# Patient Record
Sex: Female | Born: 1990 | Race: Black or African American | Hispanic: No | Marital: Single | Smoking: Current every day smoker
Health system: Southern US, Community
[De-identification: ages and names within clinical notes are randomized; demographics above are authoritative.]

## PROBLEM LIST (undated history)

## (undated) DIAGNOSIS — R569 Unspecified convulsions: Secondary | ICD-10-CM

---
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
## 2012-10-19 ENCOUNTER — Other Ambulatory Visit: Payer: Self-pay | Admitting: Physical Medicine and Rehabilitation

## 2013-04-14 ENCOUNTER — Emergency Department (HOSPITAL_COMMUNITY): Payer: Self-pay

## 2013-04-14 ENCOUNTER — Encounter (HOSPITAL_COMMUNITY): Payer: Self-pay | Admitting: Emergency Medicine

## 2013-04-14 ENCOUNTER — Emergency Department (HOSPITAL_COMMUNITY)
Admission: EM | Admit: 2013-04-14 | Discharge: 2013-04-14 | Disposition: A | Discharge status: Home / Self Care | Payer: Self-pay | Attending: Emergency Medicine | Admitting: Emergency Medicine

## 2013-04-14 DIAGNOSIS — R51 Headache: Secondary | ICD-10-CM | POA: Insufficient documentation

## 2013-04-14 DIAGNOSIS — F17200 Nicotine dependence, unspecified, uncomplicated: Secondary | ICD-10-CM | POA: Insufficient documentation

## 2013-04-14 DIAGNOSIS — R569 Unspecified convulsions: Secondary | ICD-10-CM | POA: Insufficient documentation

## 2013-04-14 DIAGNOSIS — Z3202 Encounter for pregnancy test, result negative: Secondary | ICD-10-CM | POA: Insufficient documentation

## 2013-04-14 LAB — COMPREHENSIVE METABOLIC PANEL
ALT: 8 U/L (ref 0–35)
AST: 15 U/L (ref 0–37)
Alkaline Phosphatase: 41 U/L (ref 39–117)
CO2: 25 mEq/L (ref 19–32)
Chloride: 104 mEq/L (ref 96–112)
GFR calc non Af Amer: >90 mL/min (ref >90)
Sodium: 141 mEq/L (ref 135–145)
Total Bilirubin: 0.5 mg/dL (ref 0.3–1.2)

## 2013-04-14 LAB — PREGNANCY, URINE: Preg Test, Ur: NEGATIVE

## 2013-04-14 LAB — RAPID URINE DRUG SCREEN, HOSP PERFORMED
Barbiturates: NOT DETECTED
Cocaine: NOT DETECTED
Tetrahydrocannabinol: POSITIVE — AB

## 2013-04-14 LAB — CBC WITH DIFFERENTIAL/PLATELET
Basophils Absolute: 0 10*3/uL (ref 0.0–0.1)
HCT: 36.5 % (ref 36.0–46.0)
Lymphocytes Relative: 22 % (ref 12–46)
Neutro Abs: 5.3 10*3/uL (ref 1.7–7.7)
Platelets: 285 10*3/uL (ref 150–400)
RBC: 4.39 MIL/uL (ref 3.87–5.11)
RDW: 14.5 % (ref 11.5–15.5)
WBC: 7.6 10*3/uL (ref 4.0–10.5)

## 2013-04-14 LAB — URINALYSIS, ROUTINE W REFLEX MICROSCOPIC
Bilirubin Urine: NEGATIVE
Hgb urine dipstick: NEGATIVE
Protein, ur: NEGATIVE mg/dL
Urobilinogen, UA: 1 mg/dL (ref 0.0–1.0)

## 2013-04-14 LAB — URINE MICROSCOPIC-ADD ON

## 2013-04-14 MED ORDER — IBUPROFEN 800 MG PO TABS
800.0000 mg | ORAL_TABLET | Freq: Once | ORAL | Status: AC
  Administered 2013-04-14: 800 mg via ORAL
  Filled 2013-04-14: qty 1

## 2013-04-14 NOTE — ED Provider Notes (Addendum)
CSN: [id number]     Arrival date & time 04/14/13  0727 History     First MD Initiated Contact with Patient 04/14/13 0730     No chief complaint on file.  (Consider location/radiation/quality/duration/timing/severity/associated sxs/prior Treatment) HPI Comments: Patient arrives via EMS after apparent seizure activity. Her mother heard her fall and son are saw her on the floor having tonic-clonic movements, foaming at the mouth and staring gaze. This lasted 5-10 seconds. Patient was then postictal with staring and unresponsiveness for several minutes. She is now awake and alert. She complains of a headache. No history of seizures. Patient states that her friend told her 2 days ago she had some shaking during her sleep. There is a family history of seizures. Patient denies any other medical problems. She's not take any medications. She is not on birth control. Denies any possibility of pregnancy. She did have tongue biting, no incontinence. No recent illness or fever.  The history is provided by the patient, a relative and the EMS personnel. The history is limited by the condition of the patient.    History reviewed. No pertinent past medical history. History reviewed. No pertinent past surgical history. No family history on file. History  Substance Use Topics  . Smoking status: Current Every Day Smoker -- 0.50 packs/day    Types: Cigarettes  . Smokeless tobacco: Not on file  . Alcohol Use: Yes     Comment: occasionally.   OB History   Grav Para Term Preterm Abortions TAB SAB Ect Mult Living                 Review of Systems  Constitutional: Negative for fever, activity change and appetite change.  HENT: Negative for congestion and rhinorrhea.   Respiratory: Negative for cough, chest tightness and shortness of breath.   Cardiovascular: Negative for chest pain.  Gastrointestinal: Negative for nausea, vomiting and abdominal pain.  Genitourinary: Negative for dysuria, hematuria, vaginal  bleeding and vaginal discharge.  Musculoskeletal: Negative for back pain.  Skin: Negative for rash.  Neurological: Positive for seizures and headaches. Negative for dizziness and weakness.  A complete 10 system review of systems was obtained and all systems are negative except as noted in the HPI and PMH.    Allergies  Review of patient's allergies indicates no known allergies.  Home Medications   Current Outpatient Rx  Name  Route  Sig  Dispense  Refill  . acetaminophen (TYLENOL) 500 MG tablet   Oral   Take 1,000 mg by mouth every 6 (six) hours as needed (headache).          BP 107/58  Pulse 68  Temp(Src) 98.5 F (36.9 C) (Oral)  Resp 15  Ht 5\' 1"  (1.549 m)  Wt 135 lb (61.236 kg)  BMI 25.52 kg/m2  SpO2 98%  LMP 03/28/2013 Physical Exam  Constitutional: She is oriented to person, place, and time. She appears well-developed and well-nourished. No distress.  Alert and oriented x 3.  HENT:  Head: Normocephalic and atraumatic.  Mouth/Throat: Oropharynx is clear and moist. No oropharyngeal exudate.  Abrasion to R tongue  Eyes: Conjunctivae and EOM are normal. Pupils are equal, round, and reactive to light.  Neck: Normal range of motion. Neck supple.  Upper C spine tenderness  Cardiovascular: Normal rate, regular rhythm and normal heart sounds.   No murmur heard. Pulmonary/Chest: Effort normal and breath sounds normal. No respiratory distress.  Abdominal: Soft. There is no tenderness. There is no rebound and no guarding.  Musculoskeletal: Normal range of motion. She exhibits no edema and no tenderness.  Neurological: She is alert and oriented to person, place, and time. No cranial nerve deficit. She exhibits normal muscle tone. Coordination normal.  CN 2-12 intact, no ataxia on finger to nose, no nystagmus, 5/5 strength throughout, no pronator drift, Romberg negative, normal gait.  Skin: Skin is warm.    ED Course   Procedures (including critical care time)  Labs  Reviewed  COMPREHENSIVE METABOLIC PANEL - Abnormal; Notable for the following:    Potassium 3.2 (*)    All other components within normal limits  URINALYSIS, ROUTINE W REFLEX MICROSCOPIC - Abnormal; Notable for the following:    APPearance CLOUDY (*)    Ketones, ur 40 (*)    Leukocytes, UA TRACE (*)    All other components within normal limits  URINE RAPID DRUG SCREEN (HOSP PERFORMED) - Abnormal; Notable for the following:    Tetrahydrocannabinol POSITIVE (*)    All other components within normal limits  URINE MICROSCOPIC-ADD ON - Abnormal; Notable for the following:    Squamous Epithelial / LPF MANY (*)    Bacteria, UA FEW (*)    All other components within normal limits  URINE CULTURE  PREGNANCY, URINE  CBC WITH DIFFERENTIAL   Ct Head Wo Contrast  04/14/2013   *RADIOLOGY REPORT*  Clinical Data:  Unwitnessed seizure, fall, found postictal  CT HEAD WITHOUT CONTRAST CT CERVICAL SPINE WITHOUT CONTRAST  Technique:  Multidetector CT imaging of the head and cervical spine was performed following the standard protocol without intravenous contrast.  Multiplanar CT image reconstructions of the cervical spine were also generated.  Comparison:  None  CT HEAD  Findings: Normal ventricular morphology. No midline shift or mass effect. Normal appearance of brain parenchyma. No intracranial hemorrhage, mass lesion, or acute infarction. Visualized paranasal sinuses and mastoid air cells clear. Bones unremarkable.  IMPRESSION: No acute intracranial abnormalities.  CT CERVICAL SPINE  Findings: Visualized skull base intact. Osseous mineralization normal. Vertebral body and disc space heights maintained. Prevertebral soft tissues normal thickness. No fracture, subluxation or bone destruction. Soft tissues unremarkable.  IMPRESSION: No acute cervical spine abnormalities.   Original Report Authenticated By: Ulyses Southward, M.D.   Ct Cervical Spine Wo Contrast  04/14/2013   *RADIOLOGY REPORT*  Clinical Data:   Unwitnessed seizure, fall, found postictal  CT HEAD WITHOUT CONTRAST CT CERVICAL SPINE WITHOUT CONTRAST  Technique:  Multidetector CT imaging of the head and cervical spine was performed following the standard protocol without intravenous contrast.  Multiplanar CT image reconstructions of the cervical spine were also generated.  Comparison:  None  CT HEAD  Findings: Normal ventricular morphology. No midline shift or mass effect. Normal appearance of brain parenchyma. No intracranial hemorrhage, mass lesion, or acute infarction. Visualized paranasal sinuses and mastoid air cells clear. Bones unremarkable.  IMPRESSION: No acute intracranial abnormalities.  CT CERVICAL SPINE  Findings: Visualized skull base intact. Osseous mineralization normal. Vertebral body and disc space heights maintained. Prevertebral soft tissues normal thickness. No fracture, subluxation or bone destruction. Soft tissues unremarkable.  IMPRESSION: No acute cervical spine abnormalities.   Original Report Authenticated By: Ulyses Southward, M.D.   1. Seizure     MDM  Seizure activity witnessed by the mother. It is now resolved. Patient appears back to baseline. No evidence of trauma. Vitals stable.  CT head negative for acute pathology.  Discussed with Dr. Roseanne Reno of neurology. He agrees it is difficult to say what patient's episode of shaking on Monday  was. He agrees with not starting antiepileptics at this point.  Patient is back to baseline which is confirmed by mother. She is ambulatory and tolerating by mouth. Urinalysis negative. HCG negative. Drug screen positive for THC. Followup with neurology given. Return precautions discussed including recurrent seizures, worsening headache, change in mental status, vomiting or any other concerns. Patient instructed not to drive until followup with neurology.   Glynn Octave, MD 04/14/13 1114  Glynn Octave, MD 04/14/13 1124

## 2013-04-14 NOTE — ED Notes (Signed)
Per EMS - mother heard a noise in the bedroom, pt was on the floor, pt appeared to be post-ictal. This happened about a week ago. No movements seen but starring into one direction. Pt unaware of situation, became more alert in EMS truck, able to answer questions correctly. CBG 84. BP 110/60 HR 80 NSR RR 16. EMS started a 20G in left hand. No hx of seizures.

## 2013-04-14 NOTE — ED Notes (Signed)
Pt reports she is unsure what happened, sts she was getting ready for school then next thing she knows she was in the back of the EMS truck. Pt in nad, skin warm and dry, resp e/u.

## 2013-04-14 NOTE — ED Notes (Signed)
Pt discharged.Vital signs stable and GCS 15 

## 2013-04-15 LAB — URINE CULTURE

## 2013-04-22 ENCOUNTER — Ambulatory Visit: Payer: Self-pay | Admitting: Neurology

## 2013-04-22 ENCOUNTER — Emergency Department (HOSPITAL_COMMUNITY)
Admission: EM | Admit: 2013-04-22 | Discharge: 2013-04-22 | Disposition: A | Discharge status: Home / Self Care | Payer: Self-pay | Attending: Emergency Medicine | Admitting: Emergency Medicine

## 2013-04-22 ENCOUNTER — Emergency Department (HOSPITAL_COMMUNITY): Payer: Self-pay

## 2013-04-22 ENCOUNTER — Encounter (HOSPITAL_COMMUNITY): Payer: Self-pay | Admitting: *Deleted

## 2013-04-22 DIAGNOSIS — R4789 Other speech disturbances: Secondary | ICD-10-CM | POA: Insufficient documentation

## 2013-04-22 DIAGNOSIS — F29 Unspecified psychosis not due to a substance or known physiological condition: Secondary | ICD-10-CM | POA: Insufficient documentation

## 2013-04-22 DIAGNOSIS — Z79899 Other long term (current) drug therapy: Secondary | ICD-10-CM | POA: Insufficient documentation

## 2013-04-22 DIAGNOSIS — G40909 Epilepsy, unspecified, not intractable, without status epilepticus: Secondary | ICD-10-CM | POA: Insufficient documentation

## 2013-04-22 DIAGNOSIS — H539 Unspecified visual disturbance: Secondary | ICD-10-CM | POA: Insufficient documentation

## 2013-04-22 DIAGNOSIS — R569 Unspecified convulsions: Secondary | ICD-10-CM

## 2013-04-22 DIAGNOSIS — F17200 Nicotine dependence, unspecified, uncomplicated: Secondary | ICD-10-CM | POA: Insufficient documentation

## 2013-04-22 HISTORY — DX: Unspecified convulsions: R56.9

## 2013-04-22 LAB — POCT I-STAT, CHEM 8
Calcium, Ion: 1.18 mmol/L (ref 1.12–1.23)
Chloride: 105 mEq/L (ref 96–112)
Creatinine, Ser: 0.7 mg/dL (ref 0.50–1.10)
Glucose, Bld: 88 mg/dL (ref 70–99)
HCT: 42 % (ref 36.0–46.0)
Hemoglobin: 14.3 g/dL (ref 12.0–15.0)
Potassium: 3.9 mEq/L (ref 3.5–5.1)

## 2013-04-22 MED ORDER — SODIUM CHLORIDE 0.9 % IV SOLN
1000.0000 mg | INTRAVENOUS | Status: AC
  Administered 2013-04-22: 1000 mg via INTRAVENOUS
  Filled 2013-04-22 (×2): qty 10

## 2013-04-22 MED ORDER — LEVETIRACETAM 500 MG PO TABS
500.0000 mg | ORAL_TABLET | Freq: Two times a day (BID) | ORAL | Status: DC
  Filled 2013-04-22 (×2): qty 1

## 2013-04-22 MED ORDER — LEVETIRACETAM 500 MG PO TABS: 500.0000 mg | ORAL_TABLET | Freq: Two times a day (BID) | ORAL | Status: DC

## 2013-04-22 NOTE — ED Notes (Addendum)
Per pt and pt's mother, pt reports that she was dx'd with a seizure (new onset) last week on Wed. 04/14/13.  She states that there was a follow up with a "neurologist" today 0900.  She states that while she was at school yesterday she noticed that she was having difficulty with her words "coming out correctly".  She is A/ox 4 no distress noted.  Speech clear and coherent, non slurred.  No neurological deficits noted at the present.  Pt states that she still feels as though her words are not coming out correctly.  Pt states that her neurologist is Dr. Anne Hahn

## 2013-04-22 NOTE — ED Provider Notes (Addendum)
CSN: [id number]     Arrival date & time 04/22/13  4098 History   First MD Initiated Contact with Patient 04/22/13 [phone]     Chief Complaint  Patient presents with  . Altered Mental Status   (Consider location/radiation/quality/duration/timing/severity/associated sxs/prior Treatment) HPI Patient seen on 04/14/2013 for possible seizure in which he had a shaking episode, described as tonic-clonic movement, foaming at the mouth, and biting her tongue. She had a negative workup at that time including a CT of the head, blood work, and urine studies. Her case was discussed with neurology and it was recommended that she followup as an outpatient for seizure workup. At that point no antiepileptics were started. The patient says she has been feeling better since the episode on 8/20 until yesterday. She states that while she was at school she became confused. She said she was having trouble thinking clearly her and that her words that she wanted to say he would not come out. This has persisted until today. She was supposed to followup with neurology this morning but instead came to the emergency department for evaluation. She denies focal weakness, numbness, vision changes, unsteady gait. Past Medical History  Diagnosis Date  . Seizures    History reviewed. No pertinent past surgical history. Family History  Problem Relation Age of Onset  . Hypertension Mother   . Hypertension Father   . Seizures Father    History  Substance Use Topics  . Smoking status: Current Every Day Smoker -- 0.50 packs/day    Types: Cigarettes  . Smokeless tobacco: Not on file  . Alcohol Use: Yes     Comment: occasionally.   OB History   Grav Para Term Preterm Abortions TAB SAB Ect Mult Living                 Review of Systems  Constitutional: Negative for fever and chills.  Eyes: Positive for visual disturbance.  Respiratory: Negative for shortness of breath.   Cardiovascular: Negative for chest pain.   Gastrointestinal: Negative for nausea, vomiting, abdominal pain and diarrhea.  Musculoskeletal: Negative for myalgias and back pain.  Skin: Negative for rash and wound.  Neurological: Positive for speech difficulty. Negative for dizziness, seizures, syncope, weakness, light-headedness, numbness and headaches.  Psychiatric/Behavioral: Positive for confusion.  All other systems reviewed and are negative.    Allergies  Review of patient's allergies indicates no known allergies.  Home Medications   Current Outpatient Rx  Name  Route  Sig  Dispense  Refill  . acetaminophen (TYLENOL) 500 MG tablet   Oral   Take 1,000 mg by mouth every 6 (six) hours as needed (headache).         . levETIRAcetam (KEPPRA) 500 MG tablet   Oral   Take 1 tablet (500 mg total) by mouth 2 (two) times daily.   60 tablet   0    BP 113/71  Pulse 79  Temp(Src) 98.1 F (36.7 C) (Oral)  Resp 16  Ht 5\' 1"  (1.549 m)  Wt 130 lb (58.968 kg)  BMI 24.58 kg/m2  SpO2 99%  LMP 03/28/2013 Physical Exam  Nursing note and vitals reviewed. Constitutional: She is oriented to person, place, and time. She appears well-developed and well-nourished. No distress.  HENT:  Head: Normocephalic and atraumatic.  Mouth/Throat: Oropharynx is clear and moist. No oropharyngeal exudate.  Eyes: EOM are normal. Pupils are equal, round, and reactive to light.  Neck: Normal range of motion. Neck supple.  Cardiovascular: Normal rate and regular rhythm.  Pulmonary/Chest: Effort normal and breath sounds normal. No respiratory distress. She has no wheezes. She has no rales. She exhibits no tenderness.  Abdominal: Soft. Bowel sounds are normal. She exhibits no distension and no mass. There is no tenderness. There is no rebound and no guarding.  Musculoskeletal: Normal range of motion. She exhibits no edema and no tenderness.  Neurological: She is alert and oriented to person, place, and time.  Patient is alert and oriented x3. Has  clear, goal oriented speech with some hesitancy. She has 5/5 motor in all extremities. Sensation is intact to light touch. Bilateral finger-to-nose is normal with no signs of dysmetria. She has a normal gait and walks without assistance.  Skin: Skin is warm and dry. No rash noted. No erythema.  Psychiatric: She has a normal mood and affect. Her behavior is normal.    ED Course  Procedures (including critical care time) Labs Review Labs Reviewed  POCT I-STAT, CHEM 8   Imaging Review Mr Brain Wo Contrast  04/22/2013   *RADIOLOGY REPORT*  Clinical Data: New onset seizure  MRI HEAD WITHOUT CONTRAST  Technique:  Multiplanar, multiecho pulse sequences of the brain and surrounding structures were obtained according to standard protocol without intravenous contrast.  Comparison: CT 04/14/2013  Findings: Ventricle size is normal.  Negative for Chiari malformation.  Pituitary is normal in size.  The corpus callosum is well formed.  Small hyperintensities in the frontal  and parietal white matter bilaterally.  Brainstem and cerebellum are normal.  Basal ganglia is normal.  Negative for acute infarct.  Negative for hemorrhage or mass lesion.  Temporal lobe anatomy appears normal.  Hippocampal volume and signal is normal bilaterally.  IMPRESSION: No acute abnormality.  Small hyperintensities in the frontal parietal white matter bilaterally.  These are nonspecific but are most likely chronic and could be related to chronic microvascular ischemic change or migraine headache or vasculitis.   Original Report Authenticated By: Janeece Riggers, M.D.    MDM   Discussed with Dr. Thad Ranger. She suggests getting an EEG and MRI in the ED and she will evaluate likely to be discharged home  Loren Racer, MD 04/22/13 1400  Seen by Dr. Okey Dupre in the emergency department patient had a normal EEG and MRI. Dr. Derrick Ravel is unsure whether this is an actual seizure the patient is having. Suggests putting Keppra and starting Keppra 500  mg twice a day. Patient is to followup for neurology. Return precautions have been given.  Loren Racer, MD 04/22/13 [phone]

## 2013-04-22 NOTE — Procedures (Signed)
ELECTROENCEPHALOGRAM REPORT   Patient: Bethany Phelps       Room #: ED EEG No. ID: [id number] Age: 22 y.o.        Sex: female Referring Physician: Ranae Palms Report Date:  04/22/2013        Interpreting Physician: Thana Farr D  History: [name] is an 22 y.o. female with new onset seizures  Medications:  Scheduled: . levETIRAcetam  500 mg Oral BID    Conditions of Recording:  This is a 16 channel EEG carried out with the patient in the awake, drowsy and asleep states.  Description:  The waking background activity consists of a low voltage, symmetrical, fairly well organized, 10 Hz alpha activity, seen from the parieto-occipital and posterior temporal regions.  Low voltage fast activity, poorly organized, is seen anteriorly and is at times superimposed on more posterior regions.  A mixture of theta and alpha rhythms are seen from the central and temporal regions. The patient drowses with slowing to irregular, low voltage theta and beta activity.   The patient goes in to a light sleep with symmetrical sleep spindles, vertex central sharp transients and irregular slow activity.   Hyperventilation produced a mild to moderate buildup but failed to elicit any abnormalities.  Intermittent photic stimulation was performed but failed to illicit any change in the tracing.     IMPRESSION: Normal electroencephalogram, awake, asleep and with activation procedures. There are no focal lateralizing or epileptiform features.   Thana Farr, MD [facility] [phone] 04/22/2013, 3:32 PM

## 2013-04-22 NOTE — ED Notes (Signed)
Patient is resting comfortably. Returned from EEG with mother at bedside. Patient eating crackers that were given to her by Mother.  Patient is requesting water. Spoke with Dr. Ranae Palms who ordered for patient to be NPO until seen by neurology. Patient advised nothing further to eat or drink until seen by neurology. Patient agreeable. Reconnected to cardiac monitor. Seizure precautions remain in place.

## 2013-04-22 NOTE — Progress Notes (Signed)
STAT EEG completed  

## 2013-04-22 NOTE — Consult Note (Signed)
NEURO HOSPITALIST CONSULT NOTE    Reason for Consult: Seizure  HPI:                                                                                                                                          Bethany Phelps is an 22 y.o. female who had a appointment to see Dr. Anne Hahn of [facility] today but cancled her appointment yesterday morning due to feeling well. Last evening she was studying and "felt weird, it was like I was reading the pages but the information was just not getting into my head".  There was never any jerking activity, lip smacking,tongue biting or incontinence.  Patient currently feels fine at this time.  She is very inquisitive about what post-ictal means and what medication she will be on.   Past Medical History  Diagnosis Date  . Seizures     No past surgical history on file.  Family History  Problem Relation Age of Onset  . Hypertension Mother   . Hypertension Father   . Seizures Father      Social History:  reports that she has been smoking Cigarettes.  She has been smoking about 0.50 packs per day. She does not have any smokeless tobacco history on file. She reports that  drinks alcohol. She reports that she uses illicit drugs (Marijuana).  No Known Allergies  MEDICATIONS:                                                                                                                     Current Facility-Administered Medications  Medication Dose Route Frequency Provider Last Rate Last Dose  . levETIRAcetam (KEPPRA) 1,000 mg in sodium chloride 0.9 % 100 mL IVPB  1,000 mg Intravenous STAT Ulice Dash, PA-C      . levETIRAcetam (KEPPRA) tablet 500 mg  500 mg Oral BID Ulice Dash, PA-C       Current Outpatient Prescriptions  Medication Sig Dispense Refill  . acetaminophen (TYLENOL) 500 MG tablet Take 1,000 mg by mouth every 6 (six) hours as needed (headache).          ROS:  History obtained from the patient  General ROS: negative for - chills, fatigue, fever, night sweats, weight gain or weight loss Psychological ROS: negative for - behavioral disorder, hallucinations, memory difficulties, mood swings or suicidal ideation Ophthalmic ROS: negative for - blurry vision, double vision, eye pain or loss of vision ENT ROS: negative for - epistaxis, nasal discharge, oral lesions, sore throat, tinnitus or vertigo Allergy and Immunology ROS: negative for - hives or itchy/watery eyes Hematological and Lymphatic ROS: negative for - bleeding problems, bruising or swollen lymph nodes Endocrine ROS: negative for - galactorrhea, hair pattern changes, polydipsia/polyuria or temperature intolerance Respiratory ROS: negative for - cough, hemoptysis, shortness of breath or wheezing Cardiovascular ROS: negative for - chest pain, dyspnea on exertion, edema or irregular heartbeat Gastrointestinal ROS: negative for - abdominal pain, diarrhea, hematemesis, nausea/vomiting or stool incontinence Genito-Urinary ROS: negative for - dysuria, hematuria, incontinence or urinary frequency/urgency Musculoskeletal ROS: negative for - joint swelling or muscular weakness Neurological ROS: as noted in HPI Dermatological ROS: negative for rash and skin lesion changes   Blood pressure 102/57, pulse 89, temperature 98.1 F (36.7 C), temperature source Oral, resp. rate 16, height 5\' 1"  (1.549 m), weight 58.968 kg (130 lb), last menstrual period 03/28/2013, SpO2 100.00%.   Neurologic Examination:                                                                                                      Mental Status: Alert, oriented, thought content appropriate.  Speech fluent without evidence of aphasia.  Able to follow 3 step commands without difficulty. Cranial Nerves: II: Discs flat bilaterally; Visual fields grossly normal,  pupils equal, round, reactive to light and accommodation III,IV, VI: ptosis not present, extra-ocular motions intact bilaterally V,VII: smile symmetric, facial light touch sensation normal bilaterally VIII: hearing normal bilaterally IX,X: gag reflex present XI: bilateral shoulder shrug XII: midline tongue extension Motor: Right : Upper extremity   5/5    Left:     Upper extremity   5/5  Lower extremity   5/5     Lower extremity   5/5 Tone and bulk:normal tone throughout; no atrophy noted Sensory: Pinprick and light touch intact throughout, bilaterally Deep Tendon Reflexes:  Right: Upper Extremity   Left: Upper extremity   biceps (C-5 to C-6) 2/4   biceps (C-5 to C-6) 2/4 tricep (C7) 2/4    triceps (C7) 2/4 Brachioradialis (C6) 2/4  Brachioradialis (C6) 2/4  Lower Extremity Lower Extremity  quadriceps (L-2 to L-4) 2/4   quadriceps (L-2 to L-4) 2/4 Achilles (S1) 2/4   Achilles (S1) 2/4  Plantars: Right: downgoing   Left: downgoing Cerebellar: normal finger-to-nose,  normal heel-to-shin test Gait: normal CV: pulses palpable throughout    No components found with this basename: cbc,  bmp,  coags,  chol,  tri,  ldl,  hga1c    Results for orders placed during the hospital encounter of 04/22/13 (from the past 48 hour(s))  POCT I-STAT, CHEM 8     Status: None   Collection Time    04/22/13  8:29 AM  Result Value Range   Sodium 141  135 - 145 mEq/L   Potassium 3.9  3.5 - 5.1 mEq/L   Chloride 105  96 - 112 mEq/L   BUN 6  6 - 23 mg/dL   Creatinine, Ser 1.61  0.50 - 1.10 mg/dL   Glucose, Bld 88  70 - 99 mg/dL   Calcium, Ion 0.96  0.45 - 1.23 mmol/L   TCO2 25  0 - 100 mmol/L   Hemoglobin 14.3  12.0 - 15.0 g/dL   HCT 40.9  81.1 - 91.4 %    Mr Brain Wo Contrast  04/22/2013   *RADIOLOGY REPORT*  Clinical Data: New onset seizure  MRI HEAD WITHOUT CONTRAST  Technique:  Multiplanar, multiecho pulse sequences of the brain and surrounding structures were obtained according to  standard protocol without intravenous contrast.  Comparison: CT 04/14/2013  Findings: Ventricle size is normal.  Negative for Chiari malformation.  Pituitary is normal in size.  The corpus callosum is well formed.  Small hyperintensities in the frontal  and parietal white matter bilaterally.  Brainstem and cerebellum are normal.  Basal ganglia is normal.  Negative for acute infarct.  Negative for hemorrhage or mass lesion.  Temporal lobe anatomy appears normal.  Hippocampal volume and signal is normal bilaterally.  IMPRESSION: No acute abnormality.  Small hyperintensities in the frontal parietal white matter bilaterally.  These are nonspecific but are most likely chronic and could be related to chronic microvascular ischemic change or migraine headache or vasculitis.   Original Report Authenticated By: Janeece Riggers, M.D.   EEG--showed no epileptiform activity.   No driving, operating heavy machinery, perform activities at heights, swimming or participation in water activities until release by outpatient physician.  This has been discussed with patient.   Felicie Morn PA-C [facility] [phone]  04/22/2013, 12:36 PM   Patient seen and examined.  Clinical course and management discussed.  Necessary edits performed.  I agree with the above.  Assessment and plan of care developed and discussed below.    Assessment/Plan: 22 year old female presenting with recurrent seizures.  Initial work up was unremarkable and patient was released without antiepileptic treatment.  Patient now returns with recurrent seizure activity.  Neurologic examination unremarkable.    Recommendations: 1.  MRI of the brain.  (reviewed and shows no acute abnormalities) 2.  EEG.  (performed in the ED.  EEG reviewed and unremarkable) 3.  Start Keppra.  1000mg  IV load to be given now.  Patient then to start maintenance at 500mg  po BID 4.  Follow up with Dr. Anne Hahn as an outpatient.   5.  Patient unable to drive, operate  heavy machinery, perform activities at heights and participate in water activities until release by outpatient physician.  Thana Farr, MD [facility] [phone]  04/22/2013  2:30 PM

## 2013-05-04 ENCOUNTER — Emergency Department (HOSPITAL_COMMUNITY)
Admission: EM | Admit: 2013-05-04 | Discharge: 2013-05-04 | Disposition: A | Discharge status: Home / Self Care | Payer: Medicaid Other | Attending: Emergency Medicine | Admitting: Emergency Medicine

## 2013-05-04 ENCOUNTER — Encounter (HOSPITAL_COMMUNITY): Payer: Self-pay

## 2013-05-04 DIAGNOSIS — F17200 Nicotine dependence, unspecified, uncomplicated: Secondary | ICD-10-CM | POA: Insufficient documentation

## 2013-05-04 DIAGNOSIS — Z79899 Other long term (current) drug therapy: Secondary | ICD-10-CM | POA: Insufficient documentation

## 2013-05-04 DIAGNOSIS — R569 Unspecified convulsions: Secondary | ICD-10-CM

## 2013-05-04 DIAGNOSIS — G40909 Epilepsy, unspecified, not intractable, without status epilepticus: Secondary | ICD-10-CM | POA: Insufficient documentation

## 2013-05-04 DIAGNOSIS — F1210 Cannabis abuse, uncomplicated: Secondary | ICD-10-CM | POA: Insufficient documentation

## 2013-05-04 DIAGNOSIS — F1290 Cannabis use, unspecified, uncomplicated: Secondary | ICD-10-CM

## 2013-05-04 MED ORDER — LAMOTRIGINE 25 MG PO TABS
25.0000 mg | ORAL_TABLET | Freq: Once | ORAL | Status: AC
  Administered 2013-05-04: 25 mg via ORAL
  Filled 2013-05-04: qty 1

## 2013-05-04 MED ORDER — LAMOTRIGINE 25 MG PO TABS: 25.0000 mg | ORAL_TABLET | Freq: Every day | ORAL | Status: DC

## 2013-05-04 NOTE — ED Notes (Addendum)
Pt diagnosed with seizures 3 weeks ago.  Pt was placed on Keppra.  Pt states she stopped taking Keppra on Monday because she didn't like the way it made her feel.  Pt is to follow up with Neurology on the 16th.  Pt's mother states she witnessed 2 seizures tonight.  Pt's mother states that the seizures consisted of her "being in a daze and then she had jerking/shaking movements and was foaming at the mouth."  Pt's mother states it lasted approximately 5-6 minutes and then had a second seizure lasting just a second.  Pt's mother states that when pt came to she was agitated.  Pt states she could hear her mother talking, but mother is overtalking her.  EMS reports that upon their arrival pt was ambulatory, got herself dressed, and walked to ambulance.  They report that pt was using foul language and not being cooperative with any treatment. Pt's mother anxious and states she is not taking her daughter home until someone figures out what is going on.

## 2013-05-04 NOTE — ED Provider Notes (Signed)
CSN: [id number]     Arrival date & time 05/04/13  0025 History   First MD Initiated Contact with Patient 05/04/13 0200     Chief Complaint  Patient presents with  . Seizures   (Consider location/radiation/quality/duration/timing/severity/associated sxs/prior Treatment) HPI  Please note that this is a late entry. The patient was brought into the emergency department by EMS at the request of her mother.  The patient was diagnosed within the last month with seizure disorder despite a negative EEG and normal MRI, per chart review. She was started on Keppra 500 mg twice a day. However, she elected to stop this medication two days ago because she felt emotionally labile while on the medication.  The patient smokes marijuana daily. [  Mom says that the patient had a seizure which lasted approx 7s pta. Mom describes the patient having a blank stair while sitting on mother's bed, patient then started jerking, mother helped the patient from sitting to supine position. Patient was "foaming at the mouth". Episode was self limited. No urinary incontinence. Patient says she does not recall the event.   This is the third seizure like event that the patient has had. The first one occurred < 1 month ago. The patient was seen and evaluated by Dr. Lovell Sheehan as ED consult on 8/28 and she recommended Keppra as noted above.   The patient has f/u with [facility] on Sept 16. However, mom says she she wants to know why the patient is having these seizures. The patient is accompanied by her aunt who says that she thinks the patient needs to be evaluated for a cerebral aneurysm.   The patient is without complaints at this time. She denies any recent headaches. Denies cocaine use.   Past Medical History  Diagnosis Date  . Seizures    History reviewed. No pertinent past surgical history. Family History  Problem Relation Age of Onset  . Hypertension Mother   . Hypertension Father   . Seizures Father    History  Substance  Use Topics  . Smoking status: Current Every Day Smoker -- 0.50 packs/day    Types: Cigarettes  . Smokeless tobacco: Not on file  . Alcohol Use: Yes     Comment: occasionally.   OB History   Grav Para Term Preterm Abortions TAB SAB Ect Mult Living                 Review of Systems Gen: no weight loss, fevers, chills, night sweats Eyes: no discharge or drainage, no occular pain or visual changes Nose: no epistaxis or rhinorrhea Mouth: no dental pain, no sore throat Neck: no neck pain Lungs: no SOB, cough, wheezing CV: no chest pain, palpitations, dependent edema or orthopnea Abd: no abdominal pain, nausea, vomiting GU: no dysuria or gross hematuria MSK: no myalgias or arthralgias Neuro: as per hpi, otherwise negative Skin: no rash Psyche: as per hpi, otherwise negative  Allergies  Review of patient's allergies indicates no known allergies.  Home Medications   Current Outpatient Rx  Name  Route  Sig  Dispense  Refill  . acetaminophen (TYLENOL) 500 MG tablet   Oral   Take 1,000 mg by mouth every 6 (six) hours as needed (headache).         . levETIRAcetam (KEPPRA) 500 MG tablet   Oral   Take 1 tablet (500 mg total) by mouth 2 (two) times daily.   60 tablet   0    BP 125/87  Pulse 95  Resp  29  SpO2 100%  LMP 03/28/2013 Physical Exam Gen: well developed and well nourished appearing Head: NCAT Eyes: PERL, EOMI Nose: no epistaixis or rhinorrhea Mouth/throat: mucosa is moist and pink Neck: supple, no stridor, no midline ttp Lungs: CTA B, no wheezing, rhonchi or rales CV: RRR, no mumur Abd: soft, notender, nondistended Back: no ttp, Skin: no rashese, wnl Neuro: CN ii-xii grossly intact, no focal deficits,. Motor strength 5/5 both arms and legs, normal finger to nose, normal gait Psyche; normal affect,  calm and cooperative.   ED Course  Procedures (including critical care time)   MDM  Case discussed with Dr. Vonita Moss, NeuroHospitalist on call. He recommends  initiating Lamictal 50mg  bid, in favor of Keppra, with plan to f/u with Dr. Andrey Campanile of [facility] on Sept 16, as scheduled.     Brandt Loosen, MD 05/04/13 [phone]

## 2013-05-04 NOTE — ED Notes (Signed)
Patient requested and received a sprite to drink. 

## 2013-05-04 NOTE — ED Notes (Signed)
MD at bedside. 

## 2013-05-11 ENCOUNTER — Encounter: Payer: Self-pay | Admitting: Neurology

## 2013-05-11 ENCOUNTER — Ambulatory Visit (INDEPENDENT_AMBULATORY_CARE_PROVIDER_SITE_OTHER): Payer: Self-pay | Admitting: Neurology

## 2013-05-11 VITALS — BP 116/75 | HR 90 | Ht 61.5 in | Wt 125.0 lb

## 2013-05-11 DIAGNOSIS — R569 Unspecified convulsions: Secondary | ICD-10-CM

## 2013-05-11 MED ORDER — LAMOTRIGINE ER 50 MG PO TB24: 50.0000 mg | ORAL_TABLET | Freq: Every day | ORAL | Status: DC

## 2013-05-11 NOTE — Patient Instructions (Addendum)
Overall you are doing fairly well but I do want to suggest a few things today:   Remember to drink plenty of fluid, eat healthy meals and do not skip any meals. Try to eat protein with a every meal and eat a healthy snack such as fruit or nuts in between meals. Try to keep a regular sleep-wake schedule and try to exercise daily, particularly in the form of walking, 20-30 minutes a day, if you can.   As far as your medications are concerned, I would like to suggest continuing on Lamictal using the following schedule  Week 1: Lamictal 25mg  daily in the morning Week 2 and beyond: Lamictal 50mg  once daily  We will order a lumbar puncture to check for an infectious or inflammatory cause of your seizures  I would like to see you back in 2 months, sooner if we need to. Please call us with any interim questions, concerns, problems, updates or refill requests.   Please also call us for any test results so we can go over those with you on the phone.  My clinical assistant and will answer any of your questions and relay your messages to me and also relay most of my messages to you.   Our phone number is [phone]. We also have an after hours call service for urgent matters and there is a physician on-call for urgent questions. For any emergencies you know to call 911 or go to the nearest emergency room

## 2013-05-11 NOTE — Progress Notes (Signed)
[facility]  Provider:  Dr Hosie Poisson Referring Provider: No ref. provider found Primary Care Physician:  No PCP Per Patient  CC:  seizures  HPI:  Bethany Phelps is a 22 y.o. female here as a referral from the ED for new onset seizure.  Had her first seizure on August 20, then pregnancy on August 28, then third seizure in September 9. Per her mom that first event involved her passed out hitting the floor, she is moving all extremities and foaming at the mouth for around 10 seconds. She is confused for around one hour after. Her lip during the event, no tongue biting no loss of bowel bladder. Second event was similar in nature lasting around 1 minute, with postevent confusion. There event involve patient walking into mother's room mother she was staring blankly mood her lips but not talking no eye blinking and then subsequently started to shake all over last around 10 seconds and confusion afterwards. Patient has no recollection of these events. Recalls waking up in a in the ambulance or on the floor. Prior to the most recent event she reports having a headache, felt diaphoretic, felt out of it and not her normal self. Mom notes that for the past 2-3 weeks she has not been herself, appears confused throughout the day, it fluctuates will have good days and bad days. Has had to withdrawal from college.  Had EEG nad MRI done at hospital which was unremarkable. Initially started on Keppra, felt it made her confused and tired, very emotional.Then switched to Lamictal.  Currently taking Lamictal 25 mg daily. MRI of the brain imaging was reviewed and found to be unremarkable.  No new diet or medications. No change in life or stress. No recent illnesses or infections, no fever no headache no neck stiffness.  No history of seizures. No febrile seizures. Father and maternal grandmother developed seizures in adulthood. No history of head trauma.   Review of Systems: Out of a complete 14  system review, the patient complains of only the following symptoms, and all other reviewed systems are negative. Positive for fatigue memory loss confusion headache seizure passed out depression racing thoughts  History   Social History  . Marital Status: Single    Spouse Name: N/A    Number of Children: 0  . Years of Education: N/A   Occupational History  . Not on file.   Social History Main Topics  . Smoking status: Current Every Day Smoker -- 0.50 packs/day    Types: Cigarettes  . Smokeless tobacco: Never Used  . Alcohol Use: Yes     Comment: occasionally.  . Drug Use: Yes    Special: Marijuana     Comment: everyday other day. last used on sunday.  Marland Kitchen Sexual Activity: Not on file   Other Topics Concern  . Not on file   Social History Narrative   Patient lives at home with her mother.    Patient does not work.   Patient have some college.    Patient has no children.           Family History  Problem Relation Age of Onset  . Hypertension Mother   . Hypertension Father   . Seizures Father     Past Medical History  Diagnosis Date  . Seizures     History reviewed. No pertinent past surgical history.  Current Outpatient Prescriptions  Medication Sig Dispense Refill  . acetaminophen (TYLENOL) 500 MG tablet Take 1,000 mg by mouth every  6 (six) hours as needed (headache).      . lamoTRIgine (LAMICTAL) 25 MG tablet Take 1 tablet (25 mg total) by mouth daily.  20 tablet  0  . levETIRAcetam (KEPPRA) 500 MG tablet Take 1 tablet (500 mg total) by mouth 2 (two) times daily.  60 tablet  0   No current facility-administered medications for this visit.    Allergies as of 05/11/2013  . (No Known Allergies)    Vitals: BP 116/75  Pulse 90  Ht 5' 1.5" (1.562 m)  Wt 125 lb (56.7 kg)  BMI 23.24 kg/m2  LMP 03/28/2013 Last Weight:  Wt Readings from Last 1 Encounters:  05/11/13 125 lb (56.7 kg)   Last Height:   Ht Readings from Last 1 Encounters:  05/11/13 5' 1.5"  (1.562 m)     Physical exam: Exam: Gen: NAD, conversant Eyes: anicteric sclerae, moist conjunctivae HENT: [place] Neck: Trachea midline; supple,  Lungs: CTA, no wheezing, rales, rhonic                          CV: RRR, no MRG Abdomen: Soft, non-tender;  Extremities: No peripheral edema  Skin: Normal temperature, no rash,  Psych: Appropriate affect, pleasant  Neuro: MS: AA&Ox3, appropriately interactive, normal affect   Speech: fluent w/o paraphasic error  Memory: good recent and remote recall  CN: PERRL, EOMI no nystagmus, no ptosis, sensation intact to LT V1-V3 bilat, face symmetric, no weakness, hearing grossly intact, palate elevates symmetrically, shoulder shrug 5/5 bilat,  tongue protrudes midline, no fasiculations noted.  Motor: normal bulk and tone Strength: 5/5  In all extremities  Coord: rapid alternating and point-to-point (FNF, HTS) movements intact.  Reflexes: symmetrical, bilat downgoing toes  Sens: LT intact in all extremities  Gait: posture, stance, stride and arm-swing normal. Tandem gait intact. Able to walk on heels and toes. Romberg absent.   Assessment:  After physical and neurologic examination, review of laboratory studies, imaging, neurophysiology testing and pre-existing records, assessment will be reviewed on the problem list.  Plan:  Treatment plan and additional workup will be reviewed under Problem List.  1)seizures   Bethany Phelps is a pleasant 22 year old woman who presents for initial evaluation of new-onset seizure. She has had 3 episodes in the past month of unclear etiology. These events appear to be generalized tonic-clonic in nature, with the most recent event involving a partial complex seizure which secondarily generalized. She has been evaluated the EEG and brain MRI and EEG done, both of which were unremarkable. She was initially started on Keppra but is unable to tolerate this and was switched to Lamictal. Per her mom she remains  confused intermittently throughout the day. Physical exam is overall unremarkable. Etiology of her new onset seizures is unclear at this time. We will need to rule out an underlying infectious or inflammatory process. Discussed the option of a lumbar puncture with the patient and her family. Will proceed with LP under fluoroscopy guidance. Will check for inflammatory infectious processes. Will continue patient on Lamictal with slow titration up to 50 mg daily, will consider further titration up as needed. Patient to not drive for 6 months. Follow up after LP.

## 2013-05-19 ENCOUNTER — Ambulatory Visit: Payer: Self-pay | Admitting: Neurology

## 2013-06-19 ENCOUNTER — Emergency Department (HOSPITAL_COMMUNITY)
Admission: EM | Admit: 2013-06-19 | Discharge: 2013-06-19 | Disposition: A | Discharge status: Home / Self Care | Payer: Self-pay | Attending: Emergency Medicine | Admitting: Emergency Medicine

## 2013-06-19 ENCOUNTER — Encounter (HOSPITAL_COMMUNITY): Payer: Self-pay | Admitting: Emergency Medicine

## 2013-06-19 ENCOUNTER — Emergency Department (HOSPITAL_COMMUNITY): Payer: Medicaid Other

## 2013-06-19 DIAGNOSIS — IMO0002 Reserved for concepts with insufficient information to code with codable children: Secondary | ICD-10-CM | POA: Insufficient documentation

## 2013-06-19 DIAGNOSIS — R569 Unspecified convulsions: Secondary | ICD-10-CM | POA: Insufficient documentation

## 2013-06-19 DIAGNOSIS — S43401A Unspecified sprain of right shoulder joint, initial encounter: Secondary | ICD-10-CM

## 2013-06-19 DIAGNOSIS — F17200 Nicotine dependence, unspecified, uncomplicated: Secondary | ICD-10-CM | POA: Insufficient documentation

## 2013-06-19 DIAGNOSIS — Y9389 Activity, other specified: Secondary | ICD-10-CM | POA: Insufficient documentation

## 2013-06-19 DIAGNOSIS — Z79899 Other long term (current) drug therapy: Secondary | ICD-10-CM | POA: Insufficient documentation

## 2013-06-19 DIAGNOSIS — Y92410 Unspecified street and highway as the place of occurrence of the external cause: Secondary | ICD-10-CM | POA: Insufficient documentation

## 2013-06-19 DIAGNOSIS — W010XXA Fall on same level from slipping, tripping and stumbling without subsequent striking against object, initial encounter: Secondary | ICD-10-CM | POA: Insufficient documentation

## 2013-06-19 MED ORDER — NAPROXEN 500 MG PO TABS: 500.0000 mg | ORAL_TABLET | Freq: Two times a day (BID) | ORAL | Status: DC

## 2013-06-19 MED ORDER — KETOROLAC TROMETHAMINE 60 MG/2ML IM SOLN
60.0000 mg | Freq: Once | INTRAMUSCULAR | Status: AC
  Administered 2013-06-19: 60 mg via INTRAMUSCULAR
  Filled 2013-06-19: qty 2

## 2013-06-19 MED ORDER — HYDROCODONE-ACETAMINOPHEN 5-325 MG PO TABS: ORAL_TABLET | ORAL | Status: DC

## 2013-06-19 NOTE — ED Provider Notes (Signed)
CSN: [id number]     Arrival date & time 06/19/13  7829 History  This chart was scribed for non-physician practitioner Rhea Bleacher PA-C working with Gerhard Munch, MD by Leone Payor, ED Scribe. This patient was seen in room TR06C/TR06C and the patient's care was started at 0857.   Chief Complaint  Patient presents with  . Arm Injury    The history is provided by the patient. No language interpreter was used.    HPI Comments: Bethany Phelps is a 22 y.o. female who presents to the Emergency Department complaining of right shoulder injury that occurred about 10 hours ago. Pt states she was getting out of her car and tripped on her shoe laces. She states she fell backwards on her right shoulder and back. Pt states she woke up this morning with moderate to severe, constant, unchanged pain. She denies allergies to any medications. She denies a history of a bleeding disorder. She denies numbness or weakness.    Past Medical History  Diagnosis Date  . Seizures    History reviewed. No pertinent past surgical history. Family History  Problem Relation Age of Onset  . Hypertension Mother   . Hypertension Father   . Seizures Father    History  Substance Use Topics  . Smoking status: Current Every Day Smoker -- 0.50 packs/day    Types: Cigarettes  . Smokeless tobacco: Never Used  . Alcohol Use: Yes     Comment: occasionally.   OB History   Grav Para Term Preterm Abortions TAB SAB Ect Mult Living                 Review of Systems  Constitutional: Negative for activity change.  Musculoskeletal: Positive for arthralgias (right shoulder). Negative for back pain, joint swelling and neck pain.  Skin: Negative for wound.  Neurological: Negative for weakness and numbness.  Hematological: Does not bruise/bleed easily.    Allergies  Review of patient's allergies indicates no known allergies.  Home Medications   Current Outpatient Rx  Name  Route  Sig  Dispense  Refill  .  acetaminophen (TYLENOL) 500 MG tablet   Oral   Take 1,000 mg by mouth every 6 (six) hours as needed (headache).         . LamoTRIgine 50 MG TB24   Oral   Take 1 tablet (50 mg total) by mouth daily.   30 tablet   3   . HYDROcodone-acetaminophen (NORCO/VICODIN) 5-325 MG per tablet      Take 1-2 tablets every 6 hours as needed for severe pain   8 tablet   0   . naproxen (NAPROSYN) 500 MG tablet   Oral   Take 1 tablet (500 mg total) by mouth 2 (two) times daily.   20 tablet   0    BP 128/78  Pulse 80  Temp(Src) 98.8 F (37.1 C) (Oral)  Resp 16  SpO2 98% Physical Exam  Nursing note and vitals reviewed. Constitutional: She appears well-developed and well-nourished. No distress.  HENT:  Head: Normocephalic and atraumatic.  Eyes: Conjunctivae and EOM are normal. Pupils are equal, round, and reactive to light.  Neck: Normal range of motion. Neck supple.  Cardiovascular: Normal rate.  Exam reveals no decreased pulses.   Pulmonary/Chest: Effort normal. No stridor.  Musculoskeletal: She exhibits tenderness. She exhibits no edema.       Cervical back: Normal. She exhibits normal range of motion, no tenderness and no bony tenderness.  Tenderness over acromion and tenderness  over [facility] joint. Limited ROM in the right shoulder secondary to pain.   Neurological: She is alert. No sensory deficit.  Normal sensation and strength.   Skin: Skin is warm and dry.  Normal cap refill distally.   Psychiatric: She has a normal mood and affect.    ED Course  Procedures   DIAGNOSTIC STUDIES: Oxygen Saturation is 98% on RA, normal by my interpretation.    COORDINATION OF CARE: 9:47 AM Will order XRAY of right shoulder. Discussed treatment plan with pt at bedside and pt agreed to plan.   Labs Review Labs Reviewed - No data to display Imaging Review Dg Shoulder Right  06/19/2013   CLINICAL DATA:  Fall, right shoulder pain  EXAM: RIGHT SHOULDER - 2+ VIEW  COMPARISON:  Prior chest x-ray  09/29/2010  FINDINGS: There is no evidence of fracture or dislocation. There is no evidence of arthropathy or other focal bone abnormality. Soft tissues are unremarkable.  IMPRESSION: Negative.   Electronically Signed   By: Malachy Moan M.D.   On: 06/19/2013 11:25    EKG Interpretation   None      Vital signs reviewed and are as follows: Filed Vitals:   06/19/13 1145  BP: 124/62  Pulse: 84  Temp: 98 F (36.7 C)  Resp: 16   Patient informed of x-ray results which were reviewed by myself. Patient provided with sling. Patient counseled on rice protocol and urged followup with orthopedist, referral provided, if not improved in one week.  Patient counseled on use of narcotic pain medications. Counseled not to combine these medications with others containing tylenol. Urged not to drink alcohol, drive, or perform any other activities that requires focus while taking these medications. The patient verbalizes understanding and agrees with the plan.   MDM   1. Shoulder sprain, right, initial encounter    Patient with shoulder pain after a fall. X-rays are negative. Right upper extremity is neurovascularly intact. Conservative management indicated with orthopedic followup if not improving. Possible grade 1 a.c. joint sprain.  I personally performed the services described in this documentation, which was scribed in my presence. The recorded information has been reviewed and is accurate.   Renne Crigler, PA-C 06/19/13 1439

## 2013-06-19 NOTE — ED Notes (Signed)
Onset one day ago getting out of car tripped fell onto ground right arm and right shoulder. Pain continued today unable to lift right arm due to pain.

## 2013-06-20 NOTE — ED Provider Notes (Signed)
  Medical screening examination/treatment/procedure(s) were performed by non-physician practitioner and as supervising physician I was immediately available for consultation/collaboration.      Shetara Launer, MD 06/20/13 0722 

## 2013-07-13 ENCOUNTER — Ambulatory Visit: Payer: Self-pay | Admitting: Neurology

## 2013-07-26 ENCOUNTER — Encounter: Payer: Self-pay | Admitting: Neurology

## 2013-07-26 ENCOUNTER — Ambulatory Visit (INDEPENDENT_AMBULATORY_CARE_PROVIDER_SITE_OTHER): Payer: Self-pay | Admitting: Neurology

## 2013-07-26 ENCOUNTER — Encounter (INDEPENDENT_AMBULATORY_CARE_PROVIDER_SITE_OTHER): Payer: Self-pay

## 2013-07-26 VITALS — BP 109/68 | HR 86 | Ht 63.25 in | Wt 140.0 lb

## 2013-07-26 DIAGNOSIS — R569 Unspecified convulsions: Secondary | ICD-10-CM

## 2013-07-26 NOTE — Patient Instructions (Signed)
Overall you are doing fairly well but I do want to suggest a few things today:   Remember to drink plenty of fluid, eat healthy meals and do not skip any meals. Try to eat protein with a every meal and eat a healthy snack such as fruit or nuts in between meals. Try to keep a regular sleep-wake schedule and try to exercise daily, particularly in the form of walking, 20-30 minutes a day, if you can.   Per our discussion we will try tapering you off of the Lamictal. Take 1/2 tablet of the 50mg  daily for 2 weeks and then discontinue.   If your symptoms return then we will need to discuss restarting a medication and/or doing more of a workup.   Please continue to refrain from driving for a period of 6 months without a seizure.   Follow up as needed. Please call us with any interim questions, concerns, problems, updates or refill requests.   My clinical assistant and will answer any of your questions and relay your messages to me and also relay most of my messages to you.   Our phone number is [phone]. We also have an after hours call service for urgent matters and there is a physician on-call for urgent questions. For any emergencies you know to call 911 or go to the nearest emergency room

## 2013-07-26 NOTE — Progress Notes (Signed)
[facility]  Provider:  Dr Hosie Poisson Referring Provider: No ref. provider found Primary Care Physician:  No PCP Per Patient  CC:  seizures  HPI:  Bethany Phelps is a 22 y.o. female here as follow up for concern of new onset seizure. At last visit she was instructed to have a lumbar puncture and lamictal was increased to 50mg  daily. Lumbar puncture was not completed due to resolution of symptoms. Has not had any events since last visit. Mom feels she is back to her normal self. Currently not back in school but feels she is ready to be back.   Currently taking the lamictal 50mg  once a day. No side effects. They wished to come off the medicine at this time due to cost and resolution of symptoms. No other acute symptoms at this time. No visual changes no focal motor or sensory changes. No episodes of staring off, no automatisms.   Initial visit 04/2013:  Bethany Phelps is a pleasant 22 year old woman who presents for initial evaluation of new-onset seizure. She has had 3 episodes in the past month of unclear etiology. These events appear to be generalized tonic-clonic in nature, with the most recent event involving a partial complex seizure which secondarily generalized. She has been evaluated the EEG and brain MRI and EEG done, both of which were unremarkable. She was initially started on Keppra but is unable to tolerate this and was switched to Lamictal. Per her mom she remains confused intermittently throughout the day. Physical exam is overall unremarkable. Etiology of her new onset seizures is unclear at this time. We will need to rule out an underlying infectious or inflammatory process. Discussed the option of a lumbar puncture with the patient and her family. Will proceed with LP under fluoroscopy guidance. Will check for inflammatory infectious processes. Will continue patient on Lamictal with slow titration up to 50 mg daily, will consider further titration up as needed. Patient to  not drive for 6 months. Follow up after LP.   Review of Systems: Out of a complete 14 system review, the patient complains of only the following symptoms, and all other reviewed systems are negative. Denies any positive review of systems  History   Social History  . Marital Status: Single    Spouse Name: N/A    Number of Children: 0  . Years of Education: 14   Occupational History  . Not on file.   Social History Main Topics  . Smoking status: Current Every Day Smoker -- 0.50 packs/day    Types: Cigarettes  . Smokeless tobacco: Never Used  . Alcohol Use: Yes     Comment: occasionally.  . Drug Use: No  . Sexual Activity: Not on file   Other Topics Concern  . Not on file   Social History Narrative   Patient lives at home with her mother.    Patient does not work.   Patient have some college.    Patient has no children.    Patient is right handed.   Patient does not drink any caffeine.             Family History  Problem Relation Age of Onset  . Hypertension Mother   . Hypertension Father   . Seizures Father     Past Medical History  Diagnosis Date  . Seizures     No past surgical history on file.  Current Outpatient Prescriptions  Medication Sig Dispense Refill  . acetaminophen (TYLENOL) 500 MG tablet Take 1,000  mg by mouth every 6 (six) hours as needed (headache).      Marland Kitchen HYDROcodone-acetaminophen (NORCO/VICODIN) 5-325 MG per tablet Take 1-2 tablets every 6 hours as needed for severe pain  8 tablet  0  . LamoTRIgine 50 MG TB24 Take 1 tablet (50 mg total) by mouth daily.  30 tablet  3  . naproxen (NAPROSYN) 500 MG tablet Take 1 tablet (500 mg total) by mouth 2 (two) times daily.  20 tablet  0   No current facility-administered medications for this visit.    Allergies as of 07/26/2013  . (No Known Allergies)    Vitals: BP 109/68  Pulse 86  Ht 5' 3.25" (1.607 m)  Wt 140 lb (63.504 kg)  BMI 24.59 kg/m2 Last Weight:  Wt Readings from Last 1  Encounters:  07/26/13 140 lb (63.504 kg)   Last Height:   Ht Readings from Last 1 Encounters:  07/26/13 5' 3.25" (1.607 m)     Physical exam: Exam: Gen: NAD, conversant Eyes: anicteric sclerae, moist conjunctivae HENT: [place] Neck: Trachea midline; supple,  Lungs: CTA, no wheezing, rales, rhonic                          CV: RRR, no MRG Abdomen: Soft, non-tender;  Extremities: No peripheral edema  Skin: Normal temperature, no rash,  Psych: Appropriate affect, pleasant  Neuro: MS: AA&Ox3, appropriately interactive, normal affect   Speech: fluent w/o paraphasic error  Memory: good recent and remote recall  CN: PERRL, EOMI no nystagmus, no ptosis, sensation intact to LT V1-V3 bilat, face symmetric, no weakness, hearing grossly intact, palate elevates symmetrically, shoulder shrug 5/5 bilat,  tongue protrudes midline, no fasiculations noted.  Motor: normal bulk and tone Strength: 5/5  In all extremities  Coord: rapid alternating and point-to-point (FNF, HTS) movements intact.  Reflexes: symmetrical, bilat downgoing toes  Sens: LT intact in all extremities  Gait: posture, stance, stride and arm-swing normal. Tandem gait intact. Able to walk on heels and toes. Romberg absent.   Assessment:  After physical and neurologic examination, review of laboratory studies, imaging, neurophysiology testing and pre-existing records, assessment will be reviewed on the problem list.  Plan:  Treatment plan and additional workup will be reviewed under Problem List.  1)seizures   Bethany Phelps is a pleasant 22 year old woman who presents for followup evaluation of new-onset seizure. Since last visit she has been seizure-free and palpation in her mother feels has returned to her normal baseline. Is currently taking Lamictal 50 mg daily, tolerating well with no adverse effects. They wish to come off the medicine at this time. Counseled him on the potential risks as she is at high risk of  having further seizures in the future. They understand this risk and I will do ahead with tapering off the medicine. Will decrease patient to 25 mg once a day for 2 weeks and then discontinue. Counseled him that if the episodes return she will to go back on medication in the future and likely have a lumbar puncture for diagnostic purposes. Counseled him again that she should refrain from driving for 6 months of seizure-free period. Patient to followup as needed.

## 2014-09-30 IMAGING — CT CT HEAD W/O CM
3 of 5 series · 14 of 47 positions shown, 16 images · non-contrast
Comparison: None

CT HEAD

CLINICAL DATA: Unwitnessed seizure, fall, found postictal

CT HEAD WITHOUT CONTRAST
CT CERVICAL SPINE WITHOUT CONTRAST
TECHNIQUE: Multidetector CT imaging of the head and cervical spine
was performed following the standard protocol without intravenous
contrast.  Multiplanar CT image reconstructions of the cervical
spine were also generated.

[Series 7: coronals · coronal · 0.28mm/px · 3 of 61 slices shown]
[im 21/61  brain]
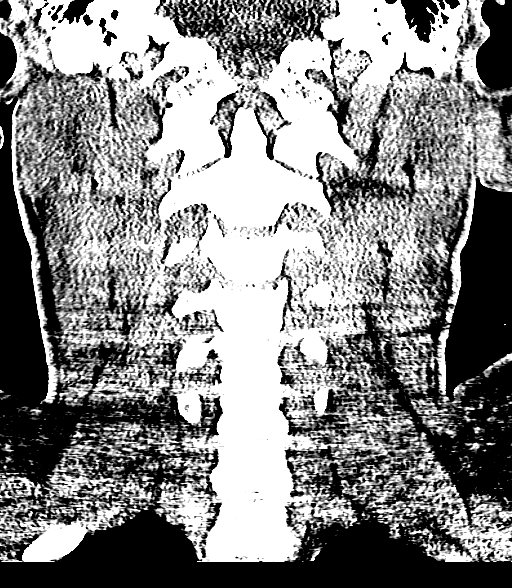
[im 27/61  brain]
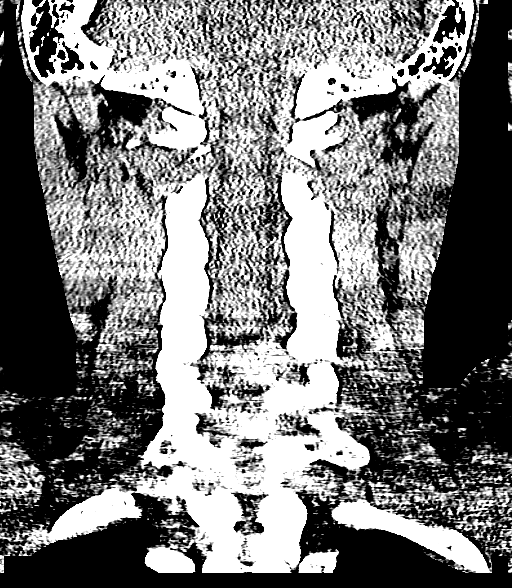
[im 34/61  brain]
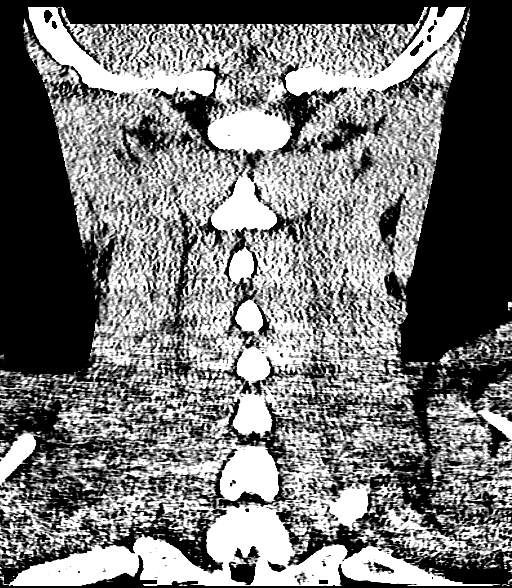

[Series 8: sagittals · sagittal · 0.26mm/px · 3 of 61 slices shown]
[im 21/61  brain]
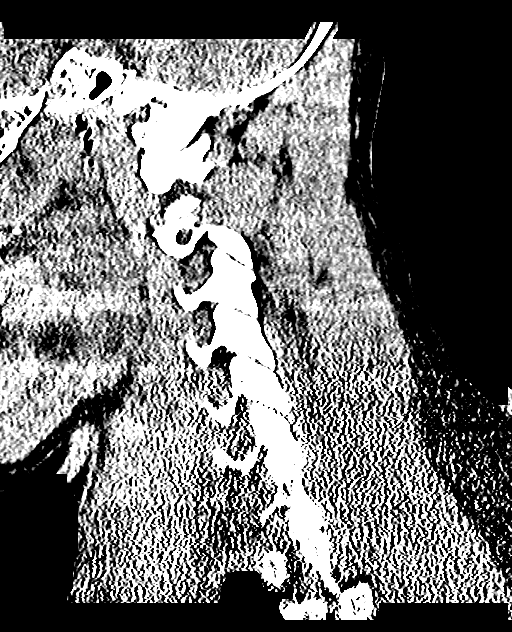
[im 31/61  brain]
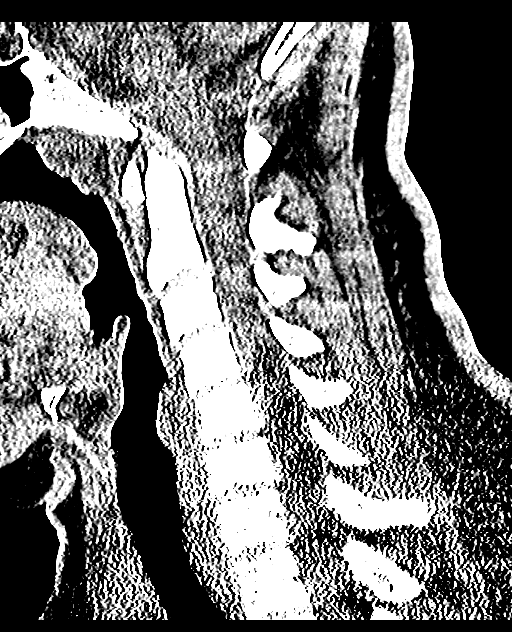
[im 41/61  brain]
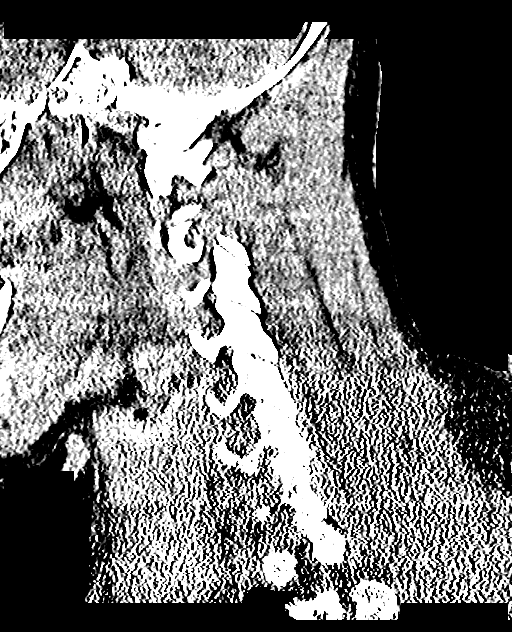

[Series 9: orthogonals · axial · 0.21mm/px · z∈[-273,-155]mm · 8 of 75 slices shown, 10 images]
[im 7/75  brain]
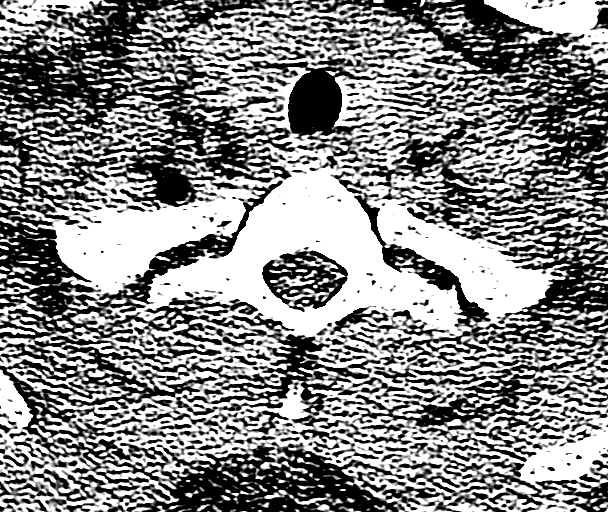
[im 7/75  bone]
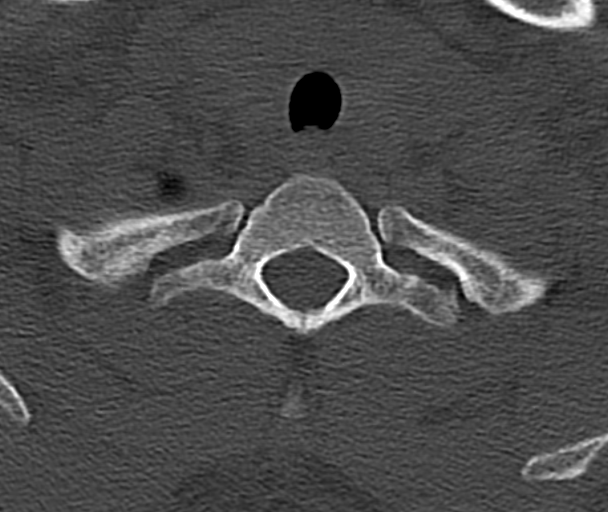
[im 14/75  brain]
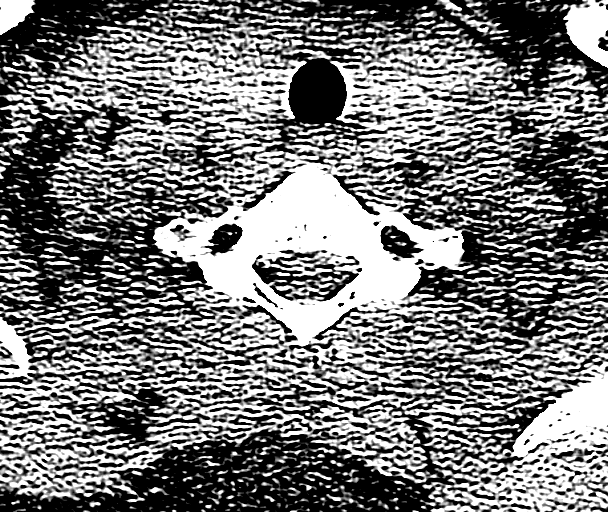
[im 27/75  brain]
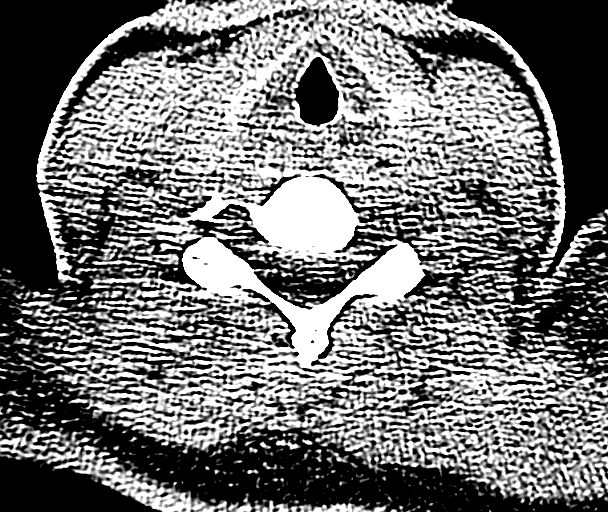
[im 34/75  brain]
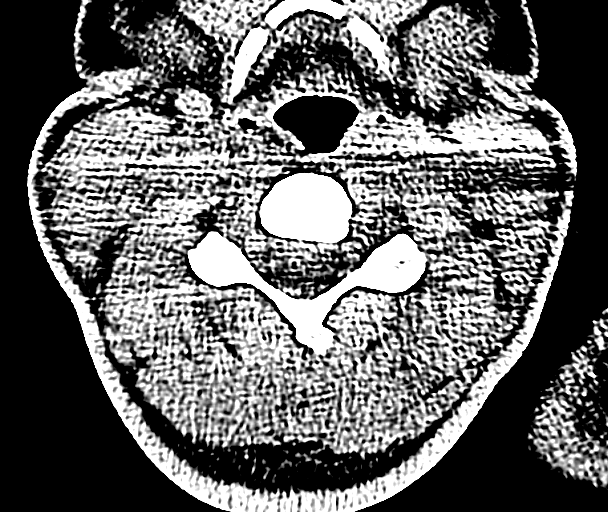
[im 41/75  brain]
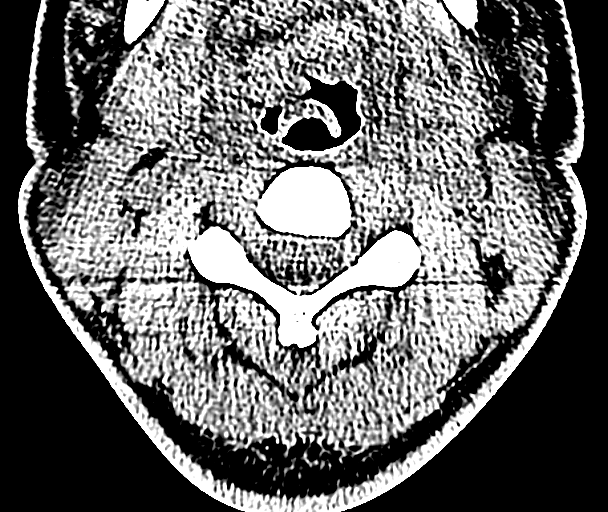
[im 41/75  bone]
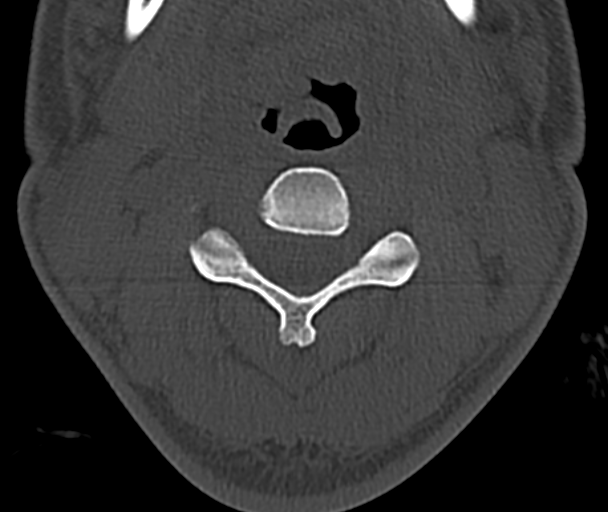
[im 48/75  brain]
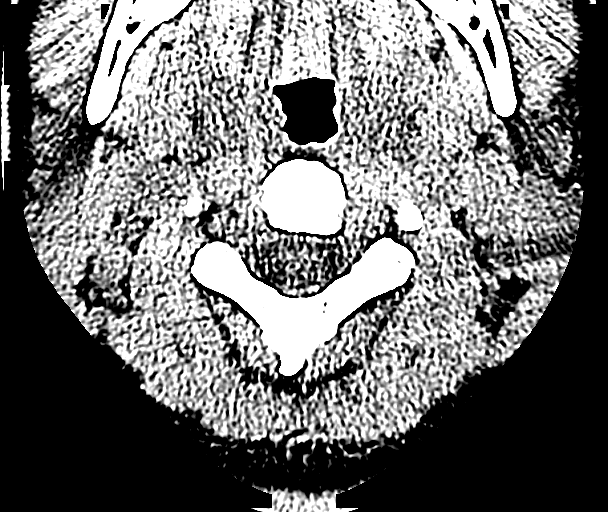
[im 61/75  brain]
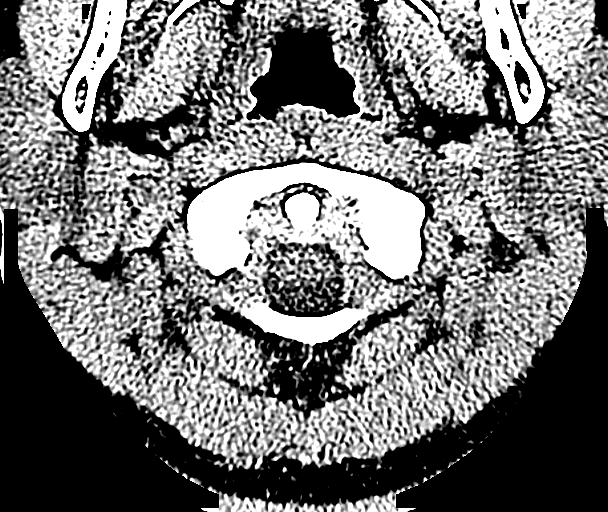
[im 68/75  brain]
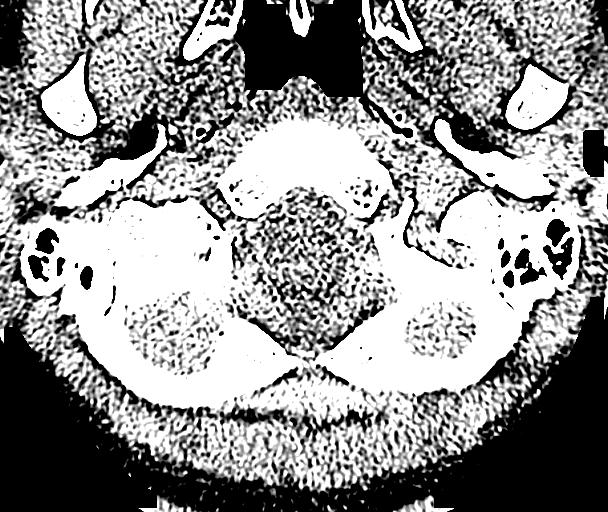

[14 of 47 positions shown; findings below may reference images not displayed]

FINDINGS: Normal ventricular morphology.
No midline shift or mass effect.
Normal appearance of brain parenchyma.
No intracranial hemorrhage, mass lesion, or acute infarction.
Visualized paranasal sinuses and mastoid air cells clear.
Bones unremarkable.
IMPRESSION: No acute intracranial abnormalities.

CT CERVICAL SPINE
FINDINGS: Visualized skull base intact.
Osseous mineralization normal.
Vertebral body and disc space heights maintained.
Prevertebral soft tissues normal thickness.
No fracture, subluxation or bone destruction.
Soft tissues unremarkable.
IMPRESSION: No acute cervical spine abnormalities.

## 2016-11-02 ENCOUNTER — Emergency Department (HOSPITAL_COMMUNITY)
Admission: EM | Admit: 2016-11-02 | Discharge: 2016-11-02 | Disposition: A | Discharge status: Home / Self Care | Payer: Self-pay | Attending: Emergency Medicine | Admitting: Emergency Medicine

## 2016-11-02 ENCOUNTER — Encounter (HOSPITAL_COMMUNITY): Payer: Self-pay | Admitting: Emergency Medicine

## 2016-11-02 DIAGNOSIS — Z79899 Other long term (current) drug therapy: Secondary | ICD-10-CM | POA: Insufficient documentation

## 2016-11-02 DIAGNOSIS — B9789 Other viral agents as the cause of diseases classified elsewhere: Secondary | ICD-10-CM

## 2016-11-02 DIAGNOSIS — J069 Acute upper respiratory infection, unspecified: Secondary | ICD-10-CM | POA: Insufficient documentation

## 2016-11-02 DIAGNOSIS — F17210 Nicotine dependence, cigarettes, uncomplicated: Secondary | ICD-10-CM | POA: Insufficient documentation

## 2016-11-02 LAB — RAPID STREP SCREEN (MED CTR MEBANE ONLY): Streptococcus, Group A Screen (Direct): NEGATIVE

## 2016-11-02 MED ORDER — DEXAMETHASONE SODIUM PHOSPHATE 10 MG/ML IJ SOLN
10.0000 mg | Freq: Once | INTRAMUSCULAR | Status: AC
  Administered 2016-11-02: 10 mg via INTRAMUSCULAR
  Filled 2016-11-02: qty 1

## 2016-11-02 MED ORDER — BENZONATATE 100 MG PO CAPS
100.0000 mg | ORAL_CAPSULE | Freq: Once | ORAL | Status: AC
  Administered 2016-11-02: 100 mg via ORAL
  Filled 2016-11-02: qty 1

## 2016-11-02 MED ORDER — GUAIFENESIN-CODEINE 100-10 MG/5ML PO SYRP
5.0000 mL | ORAL_SOLUTION | Freq: Three times a day (TID) | ORAL | 0 refills | Status: DC | PRN
Start: 2016-11-02 — End: 2022-02-04

## 2016-11-02 MED ORDER — FLUTICASONE PROPIONATE 50 MCG/ACT NA SUSP
2.0000 | Freq: Every day | NASAL | 12 refills | Status: DC
Start: 2016-11-02 — End: 2022-02-04

## 2016-11-02 NOTE — ED Triage Notes (Signed)
Patient here with URI, no fevers that she knows off.  She has a productive cough, with sore throat.  She has been having some diarrhea, no nausea or vomiting.  She states that this has been going on for a few days.

## 2016-11-02 NOTE — ED Notes (Signed)
Pt stater her and her significant other have both been having diarrhea.

## 2016-11-02 NOTE — ED Provider Notes (Signed)
[facility]-EMERGENCY DEPT Provider Note   CSN: [id number] Arrival date & time: 11/02/16  4098     History   Chief Complaint Chief Complaint  Patient presents with  . Cough  . Sore Throat    HPI Bethany Phelps is a 26 y.o. female.  HPI 26 year old African-American femalePast medical history presents to the ED today with complaints of URI symptoms including productive cough, sore throat, rhinorrhea, otalgia, diarrhea. Patient states that her symptoms started 2 days ago. States that she has yellow to green rhinorrhea and has yellow to green mucus with productive cough. She has tried over-the-counter TheraFlu which caused her to have diarrhea today. States the diarrhea is nonbloody. She denies any emesis or nausea. Denies any urinary symptoms or abdominal pain. Patient denies any sick contacts that she works at [employer]. States that her throat hurts to swallow. She denies any fever, chills. Past Medical History:  Diagnosis Date  . Seizures [facility])     Patient Active Problem List   Diagnosis Date Noted  . Seizures (HCC) 05/11/2013    History reviewed. No pertinent surgical history.  OB History    No data available       Home Medications    Prior to Admission medications   Medication Sig Start Date End Date Taking? Authorizing Provider  acetaminophen (TYLENOL) 500 MG tablet Take 1,000 mg by mouth every 6 (six) hours as needed (headache).    Historical Provider, MD  HYDROcodone-acetaminophen (NORCO/VICODIN) 5-325 MG per tablet Take 1-2 tablets every 6 hours as needed for severe pain 06/19/13   Renne Crigler, PA-C  LamoTRIgine 50 MG TB24 Take 1 tablet (50 mg total) by mouth daily. 05/11/13   Ramond Marrow, DO  naproxen (NAPROSYN) 500 MG tablet Take 1 tablet (500 mg total) by mouth 2 (two) times daily. 06/19/13   Renne Crigler, PA-C    Family History Family History  Problem Relation Age of Onset  . Hypertension Mother   . Hypertension Father   . Seizures Father     Social  History Social History  Substance Use Topics  . Smoking status: Current Every Day Smoker    Packs/day: 0.50    Types: Cigarettes  . Smokeless tobacco: Never Used  . Alcohol use Yes     Comment: occasionally.     Allergies   Patient has no known allergies.   Review of Systems Review of Systems  Constitutional: Negative for chills and fever.  HENT: Positive for congestion, postnasal drip, rhinorrhea, sinus pain, sinus pressure, sneezing and sore throat.   Eyes: Negative for visual disturbance.  Respiratory: Positive for cough. Negative for shortness of breath and wheezing.   Cardiovascular: Negative for chest pain.  Gastrointestinal: Negative for abdominal pain, diarrhea, nausea and vomiting.  Genitourinary: Negative for dysuria, hematuria and urgency.  Neurological: Negative for headaches.  All other systems reviewed and are negative.    Physical Exam Updated Vital Signs BP 124/87   Pulse 80   Temp 98.2 F (36.8 C) (Oral)   Resp 18   LMP 10/04/2016 (Exact Date)   SpO2 95%   Physical Exam  Constitutional: She is oriented to person, place, and time. She appears well-developed and well-nourished. No distress.  Non toxic appearing  HENT:  Head: Normocephalic and atraumatic.  Right Ear: Tympanic membrane, external ear and ear canal normal.  Left Ear: Tympanic membrane, external ear and ear canal normal.  Nose: Mucosal edema present. Right sinus exhibits maxillary sinus tenderness. Right sinus exhibits no frontal sinus  tenderness. Left sinus exhibits maxillary sinus tenderness. Left sinus exhibits no frontal sinus tenderness.  Mouth/Throat: Uvula is midline and mucous membranes are normal. No trismus in the jaw. Posterior oropharyngeal edema and posterior oropharyngeal erythema present. No oropharyngeal exudate or tonsillar abscesses. Tonsils are 1+ on the right. Tonsils are 1+ on the left. No tonsillar exudate.  Eyes: Conjunctivae are normal. Pupils are equal, round, and  reactive to light. Right eye exhibits no discharge. Left eye exhibits no discharge.  Neck: Normal range of motion. Neck supple.  No nuchal rigidity  Cardiovascular: Normal rate and regular rhythm.   Pulmonary/Chest: Effort normal and breath sounds normal. No respiratory distress. She has no wheezes. She has no rales. She exhibits no tenderness.  Abdominal: Soft. Bowel sounds are normal. She exhibits no distension. There is no tenderness. There is no rebound and no guarding.  Musculoskeletal: Normal range of motion.  Lymphadenopathy:    She has no cervical adenopathy.  Neurological: She is alert and oriented to person, place, and time.  Skin: Skin is warm and dry. Capillary refill takes less than 2 seconds.  Nursing note and vitals reviewed.    ED Treatments / Results  Labs (all labs ordered are listed, but only abnormal results are displayed) Labs Reviewed  RAPID STREP SCREEN (NOT [facility])  CULTURE, GROUP A STREP [facility])    EKG  EKG Interpretation None       Radiology No results found.  Procedures Procedures (including critical care time)  Medications Ordered in ED Medications  dexamethasone (DECADRON) injection 10 mg (10 mg Intramuscular Given 11/02/16 0728)  benzonatate (TESSALON) capsule 100 mg (100 mg Oral Given 11/02/16 [id number])     Initial Impression / Assessment and Plan / ED Course  I have reviewed the triage vital signs and the nursing notes.  Pertinent labs & imaging results that were available during my care of the patient were reviewed by me and considered in my medical decision making (see chart for details).     Patient presents with URI symptoms including cough, sore throat, sinus pressure, rhinorrhea, otalgia. Oropharynx without signs of peritonsillar abscess or deep neck infection. Lungs are clear to auscultation bilaterally. No indication for chest x-ray. This is likely viral illness. We'll give Decadron in the ED for sore throat. Will give Flonase and  cough medicine for home. Patients symptoms are consistent with URI, likely viral etiology. Discussed that antibiotics are not indicated for viral infections. Pt will be discharged with symptomatic treatment.  Verbalizes understanding and is agreeable with plan. Pt is hemodynamically stable & in NAD prior to dc.   Final Clinical Impressions(s) / ED Diagnoses   Final diagnoses:  Viral URI with cough    New Prescriptions New Prescriptions   FLUTICASONE (FLONASE) 50 MCG/ACT NASAL SPRAY    Place 2 sprays into both nostrils daily.   GUAIFENESIN-CODEINE (ROBITUSSIN AC) 100-10 MG/5ML SYRUP    Take 5 mLs by mouth 3 (three) times daily as needed for cough.     Rise MuKenneth T Leaphart, PA-C 11/02/16 0732    Cy BlamerApril Palumbo, MD 11/04/16 2318

## 2016-11-02 NOTE — Discharge Instructions (Signed)
This is likely a viral illness. Your strep test was negative. Motrin or Tylenol for pain and fever. Had been given steroids here. Use the Flonase for nasal congestion. Take cough medicine as needed this will make you drowsy; do not drive with it. Follow-up with primary care doctor or return to 3 days if symptoms not improved.

## 2016-11-04 LAB — CULTURE, GROUP A STREP (THRC)

## 2018-06-05 ENCOUNTER — Ambulatory Visit: Payer: Self-pay | Admitting: Physician Assistant

## 2019-05-05 ENCOUNTER — Emergency Department (HOSPITAL_COMMUNITY)
Admission: EM | Admit: 2019-05-05 | Discharge: 2019-05-05 | Disposition: A | Discharge status: Home / Self Care | Payer: Self-pay | Attending: Emergency Medicine | Admitting: Emergency Medicine

## 2019-05-05 ENCOUNTER — Encounter (HOSPITAL_COMMUNITY): Payer: Self-pay | Admitting: Emergency Medicine

## 2019-05-05 ENCOUNTER — Other Ambulatory Visit: Payer: Self-pay

## 2019-05-05 DIAGNOSIS — F17210 Nicotine dependence, cigarettes, uncomplicated: Secondary | ICD-10-CM | POA: Insufficient documentation

## 2019-05-05 DIAGNOSIS — H6122 Impacted cerumen, left ear: Secondary | ICD-10-CM | POA: Insufficient documentation

## 2019-05-05 MED ORDER — DOCUSATE SODIUM 50 MG/5ML PO LIQD
1.0000 mL | Freq: Once | ORAL | Status: AC
  Administered 2019-05-05: 10 mg via OTIC
  Filled 2019-05-05: qty 10

## 2019-05-05 MED ORDER — CARBAMIDE PEROXIDE 6.5 % OT SOLN: 10.0000 [drp] | Freq: Two times a day (BID) | OTIC | 0 refills | Status: AC

## 2019-05-05 NOTE — ED Triage Notes (Signed)
Pt states she has had clear/brown fluid leaking from her left ear for about a month. Pt states she has a lot of wax building up. Pt tried to scrape it out and has had small amounts of blood leakage. Pt is worried she now has an ear infection.

## 2019-05-05 NOTE — Discharge Instructions (Addendum)
Use eas drops as directed.   Please follow up with your primary care provider within 5-7 days for re-evaluation of your symptoms. If you do not have a primary care provider, information for a healthcare clinic has been provided for you to make arrangements for follow up care. Please return to the emergency department for any new or worsening symptoms.

## 2019-05-05 NOTE — ED Provider Notes (Signed)
[facility]   CSN: [id number] Arrival date & time: 05/05/19  0749     History   Chief Complaint Chief Complaint  Patient presents with  . Otitis Media    HPI Bethany D Harrold DonathCrump is a 28 y.o. female.     HPI   28 year old female with a history of seizures presenting for evaluation of left ear pain and wax buildup.  States she has been trying to avoid using Q-tips but she has used a "scooper "to try to get the earwax out.  She has had some drainage from both of the ears.  She does have some fullness/pain in the left ear.  Denies any fevers or other URI symptoms.  States she has had to get her ears irrigated in the past.  Past Medical History:  Diagnosis Date  . Seizures [facility](HCC)     Patient Active Problem List   Diagnosis Date Noted  . Seizures (HCC) 05/11/2013    History reviewed. No pertinent surgical history.   OB History   No obstetric history on file.      Home Medications    Prior to Admission medications   Medication Sig Start Date End Date Taking? Authorizing Provider  acetaminophen (TYLENOL) 500 MG tablet Take 1,000 mg by mouth every 6 (six) hours as needed (headache).    [provider]  carbamide peroxide (DEBROX) 6.5 % OTIC solution Place 10 drops into the left ear 2 (two) times daily for 4 days. 05/05/19 05/09/19  Brecklynn Jian S, PA-C  fluticasone (FLONASE) 50 MCG/ACT nasal spray Place 2 sprays into both nostrils daily. 11/02/16   Rise MuLeaphart, Kenneth T, PA-C  guaiFENesin-codeine (ROBITUSSIN AC) 100-10 MG/5ML syrup Take 5 mLs by mouth 3 (three) times daily as needed for cough. 11/02/16   Rise MuLeaphart, Kenneth T, PA-C  HYDROcodone-acetaminophen (NORCO/VICODIN) 5-325 MG per tablet Take 1-2 tablets every 6 hours as needed for severe pain 06/19/13   Renne CriglerGeiple, Joshua, PA-C  LamoTRIgine 50 MG TB24 Take 1 tablet (50 mg total) by mouth daily. 05/11/13   Ramond MarrowSumner, Peter J, DO  naproxen (NAPROSYN) 500 MG tablet Take 1  tablet (500 mg total) by mouth 2 (two) times daily. 06/19/13   Renne CriglerGeiple, Joshua, PA-C    Family History Family History  Problem Relation Age of Onset  . Hypertension Mother   . Hypertension Father   . Seizures Father     Social History Social History   Tobacco Use  . Smoking status: Current Every Day Smoker    Packs/day: 0.50    Types: Cigarettes  . Smokeless tobacco: Never Used  Substance Use Topics  . Alcohol use: Yes    Comment: occasionally.  . Drug use: No    Types: Marijuana     Allergies   Patient has no known allergies.   Review of Systems Review of Systems  Constitutional: Negative for fever.  HENT: Positive for ear discharge and ear pain. Negative for congestion, rhinorrhea and sore throat.      Physical Exam Updated Vital Signs BP 128/65 (BP Location: Right Arm)   Pulse 64   Temp 98.2 F (36.8 C) (Oral)   Resp 16   Ht 5\' 1"  (1.549 m)   Wt 70.8 kg   LMP 04/28/2019   SpO2 99%   BMI 29.48 kg/m   Physical Exam Vitals signs and nursing note reviewed.  Constitutional:      General: She is not in acute distress.    Appearance: She is well-developed.  HENT:     Head: Normocephalic and atraumatic.     Right Ear: Tympanic membrane, ear canal and external ear normal.     Left Ear: Ear canal normal. There is impacted cerumen.     Ears:     Comments: Unable to visualize canal 2/2 cerumen impaction    Nose: No congestion.  Eyes:     Conjunctiva/sclera: Conjunctivae normal.  Neck:     Musculoskeletal: Neck supple.  Cardiovascular:     Rate and Rhythm: Normal rate.  Pulmonary:     Effort: Pulmonary effort is normal.  Musculoskeletal: Normal range of motion.  Skin:    General: Skin is warm and dry.  Neurological:     Mental Status: She is alert.      ED Treatments / Results  Labs (all labs ordered are listed, but only abnormal results are displayed) Labs Reviewed - No data to display  EKG None  Radiology No results found.  Procedures  .Ear Cerumen Removal  Date/Time: 05/05/2019 11:14 AM Performed by: Rodney Booze, PA-C Authorized by: Rodney Booze, PA-C   Consent:    Consent obtained:  Verbal   Consent given by:  Patient   Risks discussed:  Bleeding, infection, incomplete removal and pain   Alternatives discussed:  No treatment Procedure details:    Location:  L ear   Procedure type comment:  Currette and irrigation Post-procedure details:    Post-procedure ear inspection: still some cerumen present and unable to visualize tm.   Hearing quality:  Improved   Patient tolerance of procedure:  Tolerated well, no immediate complications   (including critical care time)  Medications Ordered in ED Medications  docusate (COLACE) 50 MG/5ML liquid 10 mg (10 mg Left EAR Given 05/05/19 1041)     Initial Impression / Assessment and Plan / ED Course  I have reviewed the triage vital signs and the nursing notes.  Pertinent labs & imaging results that were available during my care of the patient were reviewed by me and considered in my medical decision making (see chart for details).      Final Clinical Impressions(s) / ED Diagnoses   Final diagnoses:  Impacted cerumen of left ear   28 year old female presenting with cerumen impaction in the left ear.  Attempted disimpaction with curette and small amount of cerumen was obtained.  Given liquid Colace and attempted irrigation.  Small amount of earwax obtained.  Patient had improvement of hearing.  No signs of otitis externa.  Doubt OM without fevers or other symptoms.  She also states pain improved after irrigation.  She was given Debrox eardrops.  Advised to follow-up and return if worse.  ED Discharge Orders         Ordered    carbamide peroxide (DEBROX) 6.5 % OTIC solution  2 times daily     05/05/19 [address], Ashling Roane S, PA-C 05/05/19 1116    Charlesetta Shanks, MD 05/07/19 1149

## 2019-12-08 ENCOUNTER — Encounter (HOSPITAL_COMMUNITY): Payer: Self-pay | Admitting: Emergency Medicine

## 2019-12-08 ENCOUNTER — Other Ambulatory Visit: Payer: Self-pay

## 2019-12-08 ENCOUNTER — Emergency Department (HOSPITAL_COMMUNITY)
Admission: EM | Admit: 2019-12-08 | Discharge: 2019-12-08 | Disposition: A | Discharge status: Home / Self Care | Payer: Self-pay | Attending: Emergency Medicine | Admitting: Emergency Medicine

## 2019-12-08 DIAGNOSIS — Z5321 Procedure and treatment not carried out due to patient leaving prior to being seen by health care provider: Secondary | ICD-10-CM | POA: Insufficient documentation

## 2019-12-08 DIAGNOSIS — K0889 Other specified disorders of teeth and supporting structures: Secondary | ICD-10-CM | POA: Insufficient documentation

## 2019-12-08 NOTE — ED Triage Notes (Signed)
Pt complaint of right lower bottom dental pain for a week or two that "popped" yesterday; concern for infection and wants medication to treat it.

## 2019-12-08 NOTE — ED Notes (Signed)
Called for V/S recheck x1 No answer 

## 2019-12-09 ENCOUNTER — Other Ambulatory Visit: Payer: Self-pay

## 2019-12-09 ENCOUNTER — Ambulatory Visit (HOSPITAL_COMMUNITY)
Admission: EM | Admit: 2019-12-09 | Discharge: 2019-12-09 | Disposition: A | Discharge status: Home / Self Care | Payer: Self-pay | Attending: Physician Assistant | Admitting: Physician Assistant

## 2019-12-09 ENCOUNTER — Encounter (HOSPITAL_COMMUNITY): Payer: Self-pay

## 2019-12-09 DIAGNOSIS — N898 Other specified noninflammatory disorders of vagina: Secondary | ICD-10-CM | POA: Insufficient documentation

## 2019-12-09 DIAGNOSIS — Z3202 Encounter for pregnancy test, result negative: Secondary | ICD-10-CM

## 2019-12-09 DIAGNOSIS — K047 Periapical abscess without sinus: Secondary | ICD-10-CM | POA: Insufficient documentation

## 2019-12-09 LAB — POC URINE PREG, ED: Preg Test, Ur: NEGATIVE

## 2019-12-09 LAB — POCT PREGNANCY, URINE: Preg Test, Ur: NEGATIVE

## 2019-12-09 LAB — HIV ANTIBODY (ROUTINE TESTING W REFLEX): HIV Screen 4th Generation wRfx: NONREACTIVE

## 2019-12-09 MED ORDER — IBUPROFEN 800 MG PO TABS: 800.0000 mg | ORAL_TABLET | Freq: Three times a day (TID) | ORAL | 0 refills | Status: DC

## 2019-12-09 MED ORDER — AMOXICILLIN-POT CLAVULANATE 875-125 MG PO TABS: 1.0000 | ORAL_TABLET | Freq: Two times a day (BID) | ORAL | 0 refills | Status: AC

## 2019-12-09 NOTE — ED Triage Notes (Addendum)
Pt states she has been having dental pain. Pt states she had a abscess to burst yesterday. Pt states she would like to be STD tested because she has been having vaginal discharge x 5 days.

## 2019-12-09 NOTE — ED Provider Notes (Signed)
[facility]    CSN: [id number] Arrival date & time: 12/09/19  9518      History   Chief Complaint Chief Complaint  Patient presents with  . Dental Pain  . SEXUALLY TRANSMITTED DISEASE    HPI Bethany Phelps is a 29 y.o. female.   Patient presents urgent care for evaluation of possible dental abscess.  She reports she believes she has had a dental abscess in the upper back portion of her mouth that has been developing over 1 week. She reports it popped yesterday which prompted her to go to the ED, however she left due to wait. She denies fever and chills. Denies difficulty swallowing or breathing.   She also reports vaginal discharge and mild irritation. Reports liquid, white/yellow discharge. Sometimes it is thicker. Denies vaginal pain.      Past Medical History:  Diagnosis Date  . Seizures [facility])     Patient Active Problem List   Diagnosis Date Noted  . Seizures (HCC) 05/11/2013    History reviewed. No pertinent surgical history.  OB History   No obstetric history on file.      Home Medications    Prior to Admission medications   Medication Sig Start Date End Date Taking? Authorizing Provider  acetaminophen (TYLENOL) 500 MG tablet Take 1,000 mg by mouth every 6 (six) hours as needed (headache).    [provider]  amoxicillin-clavulanate (AUGMENTIN) 875-125 MG tablet Take 1 tablet by mouth 2 (two) times daily for 10 days. 12/09/19 12/19/19  Yuleni Burich, Veryl Speak, PA-C  fluticasone (FLONASE) 50 MCG/ACT nasal spray Place 2 sprays into both nostrils daily. Patient not taking: Reported on 12/09/2019 11/02/16   Demetrios Loll T, PA-C  guaiFENesin-codeine (ROBITUSSIN AC) 100-10 MG/5ML syrup Take 5 mLs by mouth 3 (three) times daily as needed for cough. Patient not taking: Reported on 12/09/2019 11/02/16   Demetrios Loll T, PA-C  HYDROcodone-acetaminophen (NORCO/VICODIN) 5-325 MG per tablet Take 1-2 tablets every 6 hours as needed for severe pain  Patient not taking: Reported on 12/09/2019 06/19/13   Renne Crigler, PA-C  ibuprofen (ADVIL) 800 MG tablet Take 1 tablet (800 mg total) by mouth 3 (three) times daily. 12/09/19   Maleigha Colvard, Veryl Speak, PA-C  LamoTRIgine 50 MG TB24 Take 1 tablet (50 mg total) by mouth daily. Patient not taking: Reported on 12/09/2019 05/11/13   Ramond Marrow, DO  naproxen (NAPROSYN) 500 MG tablet Take 1 tablet (500 mg total) by mouth 2 (two) times daily. Patient not taking: Reported on 12/09/2019 06/19/13   Renne Crigler, PA-C    Family History Family History  Problem Relation Age of Onset  . Hypertension Mother   . Hypertension Father   . Seizures Father     Social History Social History   Tobacco Use  . Smoking status: Current Every Day Smoker    Packs/day: 0.50    Types: Cigarettes  . Smokeless tobacco: Never Used  Substance Use Topics  . Alcohol use: Yes    Comment: occasionally.  . Drug use: No    Types: Marijuana     Allergies   Patient has no known allergies.   Review of Systems Review of Systems   Physical Exam Triage Vital Signs ED Triage Vitals  Enc Vitals Group     BP      Pulse      Resp      Temp      Temp src      SpO2  Weight      Height      Head Circumference      Peak Flow      Pain Score      Pain Loc      Pain Edu?      Excl. in GC?    No data found.  Updated Vital Signs BP 124/85 (BP Location: Right Arm)   Pulse 62   Temp 98.1 F (36.7 C) (Oral)   Resp 16   Wt 155 lb 12.8 oz (70.7 kg)   LMP 11/14/2019   SpO2 100%   BMI 30.43 kg/m   Visual Acuity Right Eye Distance:   Left Eye Distance:   Bilateral Distance:    Right Eye Near:   Left Eye Near:    Bilateral Near:     Physical Exam Vitals and nursing note reviewed.  Constitutional:      General: She is not in acute distress.    Appearance: She is well-developed.  HENT:     Head: Normocephalic and atraumatic.     Mouth/Throat:     Dentition: Abnormal dentition. Dental tenderness,  gingival swelling and dental abscesses present.     Pharynx: Oropharynx is clear. No posterior oropharyngeal erythema or uvula swelling.  Eyes:     Conjunctiva/sclera: Conjunctivae normal.  Cardiovascular:     Rate and Rhythm: Normal rate and regular rhythm.     Heart sounds: No murmur.  Pulmonary:     Effort: Pulmonary effort is normal. No respiratory distress.     Breath sounds: Normal breath sounds.  Abdominal:     Palpations: Abdomen is soft.     Tenderness: There is no abdominal tenderness.  Musculoskeletal:     Cervical back: Neck supple.  Skin:    General: Skin is warm and dry.  Neurological:     Mental Status: She is alert.      UC Treatments / Results  Labs (all labs ordered are listed, but only abnormal results are displayed) Labs Reviewed  HIV ANTIBODY (ROUTINE TESTING W REFLEX)  RPR  POC URINE PREG, ED  POCT PREGNANCY, URINE  CERVICOVAGINAL ANCILLARY ONLY    EKG   Radiology No results found.  Procedures Procedures (including critical care time)  Medications Ordered in UC Medications - No data to display  Initial Impression / Assessment and Plan / UC Course  I have reviewed the triage vital signs and the nursing notes.  Pertinent labs & imaging results that were available during my care of the patient were reviewed by me and considered in my medical decision making (see chart for details).     #Dental Abscess #Vaginal Discharge Patient is a 29 year old with dental abscess/infection and vaginal discharge. Will start augmentin and defer initiation of treatment of vaginal symptoms pending swabs to avoid excessive antibiotic prescriptions. Patient is agreeable to this plan. Ibuprofen for pain and inflammation. Return and follow up precautions discussed.   Final Clinical Impressions(s) / UC Diagnoses   Final diagnoses:  Dental abscess  Vaginal discharge     Discharge Instructions     Start the augmentin- 2 times a day for 10 days. Take the  ibuprofen for pain Follow up with your dentist  We will wait on your other results to start treatment. We will call you if anything comes back requiring treatment  If dental infection worsens, you have fever, difficulty breathing or swallowing please return or report to the emergency Department      ED Prescriptions  Medication Sig Dispense Auth. Provider   amoxicillin-clavulanate (AUGMENTIN) 875-125 MG tablet Take 1 tablet by mouth 2 (two) times daily for 10 days. 20 tablet Janna Oak, Marguerita Beards, PA-C   ibuprofen (ADVIL) 800 MG tablet Take 1 tablet (800 mg total) by mouth 3 (three) times daily. 21 tablet Sequoya Hogsett, Marguerita Beards, PA-C     PDMP not reviewed this encounter.   Purnell Shoemaker, PA-C 12/09/19 2343

## 2019-12-09 NOTE — Discharge Instructions (Signed)
Start the augmentin- 2 times a day for 10 days. Take the ibuprofen for pain Follow up with your dentist  We will wait on your other results to start treatment. We will call you if anything comes back requiring treatment  If dental infection worsens, you have fever, difficulty breathing or swallowing please return or report to the emergency Department

## 2019-12-10 ENCOUNTER — Telehealth (HOSPITAL_COMMUNITY): Payer: Self-pay

## 2019-12-10 LAB — CERVICOVAGINAL ANCILLARY ONLY
Bacterial Vaginitis (gardnerella): POSITIVE — AB
Candida Glabrata: NEGATIVE
Candida Vaginitis: NEGATIVE
Chlamydia: NEGATIVE
Comment: NEGATIVE
Comment: NEGATIVE
Comment: NEGATIVE
Comment: NEGATIVE
Comment: NEGATIVE
Comment: NORMAL
Neisseria Gonorrhea: NEGATIVE
Trichomonas: POSITIVE — AB

## 2019-12-10 LAB — RPR: RPR Ser Ql: NONREACTIVE

## 2019-12-10 MED ORDER — METRONIDAZOLE 500 MG PO TABS: 500.0000 mg | ORAL_TABLET | Freq: Two times a day (BID) | ORAL | 0 refills | Status: DC

## 2021-09-12 ENCOUNTER — Ambulatory Visit (HOSPITAL_COMMUNITY)
Admission: EM | Admit: 2021-09-12 | Discharge: 2021-09-12 | Disposition: A | Discharge status: Home / Self Care | Payer: 59

## 2021-09-12 ENCOUNTER — Other Ambulatory Visit: Payer: Self-pay

## 2022-01-31 ENCOUNTER — Encounter: Payer: Self-pay | Admitting: Family Medicine

## 2022-01-31 NOTE — Progress Notes (Unsigned)
   HPI:  Patient presents today for a new patient appointment to establish general primary care.  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  Concerns today: ***  Past Medical Hx:  -***  Past Surgical Hx:  -keloid removed ***  Family Hx: updated in Epic  Social Hx:  - occupation: works full time at [employer] - highest level of education: associates degree - sexual partners: male, single - lives with: dogs - tobacco: smokes cigarettes since 2012 - alcohol: wine or liquor, 1 drink per day - drugs: marijuana - surrogate decision maker: mother, Mannie Wineland - transportation: owns a car - exercise: no regular exercise - hobbies: park, reading, create things around the house  Health Maintenance:  -***  PHYSICAL EXAM: There were no vitals taken for this visit. Gen: *** HEENT: *** Heart: *** Lungs: *** Abdomen: *** Neuro: ***  ASSESSMENT/PLAN:  Health maintenance:  -***   No problem-specific Assessment & Plan notes found for this encounter.     FOLLOW UP: Follow up in *** for ***  [place] J. Pollie Meyer, MD [facility]

## 2022-02-04 ENCOUNTER — Ambulatory Visit (INDEPENDENT_AMBULATORY_CARE_PROVIDER_SITE_OTHER): Payer: 59 | Admitting: Family Medicine

## 2022-02-04 ENCOUNTER — Other Ambulatory Visit (HOSPITAL_COMMUNITY)
Admission: RE | Admit: 2022-02-04 | Discharge: 2022-02-04 | Disposition: A | Discharge status: Home / Self Care | Payer: 59 | Source: Ambulatory Visit | Attending: Family Medicine | Admitting: Family Medicine

## 2022-02-04 ENCOUNTER — Encounter: Payer: Self-pay | Admitting: Family Medicine

## 2022-02-04 VITALS — BP 130/80 | HR 80 | Ht 61.0 in | Wt 153.0 lb

## 2022-02-04 DIAGNOSIS — Z124 Encounter for screening for malignant neoplasm of cervix: Secondary | ICD-10-CM | POA: Diagnosis not present

## 2022-02-04 DIAGNOSIS — Z30011 Encounter for initial prescription of contraceptive pills: Secondary | ICD-10-CM

## 2022-02-04 DIAGNOSIS — Z720 Tobacco use: Secondary | ICD-10-CM | POA: Insufficient documentation

## 2022-02-04 DIAGNOSIS — Z1159 Encounter for screening for other viral diseases: Secondary | ICD-10-CM

## 2022-02-04 DIAGNOSIS — E010 Iodine-deficiency related diffuse (endemic) goiter: Secondary | ICD-10-CM | POA: Diagnosis not present

## 2022-02-04 DIAGNOSIS — Z113 Encounter for screening for infections with a predominantly sexual mode of transmission: Secondary | ICD-10-CM

## 2022-02-04 DIAGNOSIS — Z7689 Persons encountering health services in other specified circumstances: Secondary | ICD-10-CM | POA: Diagnosis not present

## 2022-02-04 DIAGNOSIS — Z13220 Encounter for screening for lipoid disorders: Secondary | ICD-10-CM | POA: Diagnosis not present

## 2022-02-04 DIAGNOSIS — Z309 Encounter for contraceptive management, unspecified: Secondary | ICD-10-CM | POA: Insufficient documentation

## 2022-02-04 DIAGNOSIS — R569 Unspecified convulsions: Secondary | ICD-10-CM

## 2022-02-04 MED ORDER — NICOTINE POLACRILEX 2 MG MT GUM: 2.0000 mg | CHEWING_GUM | OROMUCOSAL | 0 refills | Status: DC | PRN

## 2022-02-04 MED ORDER — NORGESTIMATE-ETH ESTRADIOL 0.25-35 MG-MCG PO TABS: 1.0000 | ORAL_TABLET | Freq: Every day | ORAL | 11 refills | Status: AC

## 2022-02-04 NOTE — Assessment & Plan Note (Addendum)
Patient motivated to quit.  Discussed options.  She elected for gum prescription, sent in.  Follow-up in 6 weeks to check in.  Also encouraged decreasing marijuana use.

## 2022-02-04 NOTE — Patient Instructions (Addendum)
It was great to see you today!  Did pap smear and infection testing today. Results will be visible in mychart in a few days.  Sent in nicotine gum for you Chew one piece as needed, try to space out over time to taper off it  Follow up in 6 weeks to see how you're doing with cutting back on smoking  Sent in birth control for you  Be well, Dr. Pollie Meyer  Health Maintenance, Female Adopting a healthy lifestyle and getting preventive care are important in promoting health and wellness. Ask your health care provider about: The right schedule for you to have regular tests and exams. Things you can do on your own to prevent diseases and keep yourself healthy. What should I know about diet, weight, and exercise? Eat a healthy diet  Eat a diet that includes plenty of vegetables, fruits, low-fat dairy products, and lean protein. Do not eat a lot of foods that are high in solid fats, added sugars, or sodium. Maintain a healthy weight Body mass index (BMI) is used to identify weight problems. It estimates body fat based on height and weight. Your health care provider can help determine your BMI and help you achieve or maintain a healthy weight. Get regular exercise Get regular exercise. This is one of the most important things you can do for your health. Most adults should: Exercise for at least 150 minutes each week. The exercise should increase your heart rate and make you sweat (moderate-intensity exercise). Do strengthening exercises at least twice a week. This is in addition to the moderate-intensity exercise. Spend less time sitting. Even light physical activity can be beneficial. Watch cholesterol and blood lipids Have your blood tested for lipids and cholesterol at 29 years of age, then have this test every 5 years. Have your cholesterol levels checked more often if: Your lipid or cholesterol levels are high. You are older than 29 years of age. You are at high risk for heart  disease. What should I know about cancer screening? Depending on your health history and family history, you may need to have cancer screening at various ages. This may include screening for: Breast cancer. Cervical cancer. Colorectal cancer. Skin cancer. Lung cancer. What should I know about heart disease, diabetes, and high blood pressure? Blood pressure and heart disease High blood pressure causes heart disease and increases the risk of stroke. This is more likely to develop in people who have high blood pressure readings or are overweight. Have your blood pressure checked: Every 3-5 years if you are 72-38 years of age. Every year if you are 43 years old or older. Diabetes Have regular diabetes screenings. This checks your fasting blood sugar level. Have the screening done: Once every three years after age 3 if you are at a normal weight and have a low risk for diabetes. More often and at a younger age if you are overweight or have a high risk for diabetes. What should I know about preventing infection? Hepatitis B If you have a higher risk for hepatitis B, you should be screened for this virus. Talk with your health care provider to find out if you are at risk for hepatitis B infection. Hepatitis C Testing is recommended for: Everyone born from [birth year] through 1963. Anyone with known risk factors for hepatitis C. Sexually transmitted infections (STIs) Get screened for STIs, including gonorrhea and chlamydia, if: You are sexually active and are younger than 29 years of age. You are older than 29 years  of age and your health care provider tells you that you are at risk for this type of infection. Your sexual activity has changed since you were last screened, and you are at increased risk for chlamydia or gonorrhea. Ask your health care provider if you are at risk. Ask your health care provider about whether you are at high risk for HIV. Your health care provider may recommend a  prescription medicine to help prevent HIV infection. If you choose to take medicine to prevent HIV, you should first get tested for HIV. You should then be tested every 3 months for as long as you are taking the medicine. Pregnancy If you are about to stop having your period (premenopausal) and you may become pregnant, seek counseling before you get pregnant. Take 400 to 800 micrograms (mcg) of folic acid every day if you become pregnant. Ask for birth control (contraception) if you want to prevent pregnancy. Osteoporosis and menopause Osteoporosis is a disease in which the bones lose minerals and strength with aging. This can result in bone fractures. If you are 53 years old or older, or if you are at risk for osteoporosis and fractures, ask your health care provider if you should: Be screened for bone loss. Take a calcium or vitamin D supplement to lower your risk of fractures. Be given hormone replacement therapy (HRT) to treat symptoms of menopause. Follow these instructions at home: Alcohol use Do not drink alcohol if: Your health care provider tells you not to drink. You are pregnant, may be pregnant, or are planning to become pregnant. If you drink alcohol: Limit how much you have to: 0-1 drink a day. Know how much alcohol is in your drink. In the [place] one drink equals one 12 oz bottle of beer (355 mL), one 5 oz glass of wine (148 mL), or one 1 oz glass of hard liquor (44 mL). Lifestyle Do not use any products that contain nicotine or tobacco. These products include cigarettes, chewing tobacco, and vaping devices, such as e-cigarettes. If you need help quitting, ask your health care provider. Do not use street drugs. Do not share needles. Ask your health care provider for help if you need support or information about quitting drugs. General instructions Schedule regular health, dental, and eye exams. Stay current with your vaccines. Tell your health care provider if: You often  feel depressed. You have ever been abused or do not feel safe at home. Summary Adopting a healthy lifestyle and getting preventive care are important in promoting health and wellness. Follow your health care provider's instructions about healthy diet, exercising, and getting tested or screened for diseases. Follow your health care provider's instructions on monitoring your cholesterol and blood pressure. This information is not intended to replace advice given to you by your health care provider. Make sure you discuss any questions you have with your health care provider. Document Revised: 01/01/2021 Document Reviewed: 01/01/2021 Elsevier Patient Education  [place].

## 2022-02-04 NOTE — Assessment & Plan Note (Addendum)
Reviewed options for birth control with patient.  After discussion of options, she selects combined oral contraceptive pills.  Denies history of migraine with aura, cardiac, or liver issues.  Reviewed risks of blood clots, stroke, heart attack, and specifically that these risks are elevated even further by her current smoking.  She accepts these risks and agrees to continue with the prescription.  We are working on helping her decrease smoking.  Rx sent in for [place].

## 2022-02-04 NOTE — Assessment & Plan Note (Signed)
Remote history of seizures, none recently.  Has been off antiepileptics for at least 5 years.  We will monitor.  Avoid medications that lower seizure threshold.

## 2022-02-05 LAB — HIV ANTIBODY (ROUTINE TESTING W REFLEX): HIV Screen 4th Generation wRfx: NONREACTIVE

## 2022-02-05 LAB — LIPID PANEL
Chol/HDL Ratio: 2 ratio (ref 0.0–4.4)
Cholesterol, Total: 175 mg/dL (ref 100–199)
HDL: 86 mg/dL (ref >39)
LDL Chol Calc (NIH): 74 mg/dL (ref 0–99)
Triglycerides: 82 mg/dL (ref 0–149)
VLDL Cholesterol Cal: 15 mg/dL (ref 5–40)

## 2022-02-05 LAB — BASIC METABOLIC PANEL
BUN/Creatinine Ratio: 9 (ref 9–23)
BUN: 7 mg/dL (ref 6–20)
CO2: 21 mmol/L (ref 20–29)
Calcium: 10 mg/dL (ref 8.7–10.2)
Chloride: 99 mmol/L (ref 96–106)
Creatinine, Ser: 0.77 mg/dL (ref 0.57–1.00)
Glucose: 89 mg/dL (ref 70–99)
Potassium: 3.9 mmol/L (ref 3.5–5.2)
Sodium: 138 mmol/L (ref 134–144)
eGFR: 107 mL/min/{1.73_m2} (ref >59)

## 2022-02-05 LAB — HCV AB W REFLEX TO QUANT PCR: HCV Ab: NONREACTIVE

## 2022-02-05 LAB — HCV INTERPRETATION

## 2022-02-05 LAB — TSH RFX ON ABNORMAL TO FREE T4: TSH: 0.963 u[IU]/mL (ref 0.450–4.500)

## 2022-02-05 LAB — RPR: RPR Ser Ql: NONREACTIVE

## 2022-02-06 LAB — CYTOLOGY - PAP
Chlamydia: NEGATIVE
Comment: NEGATIVE
Comment: NEGATIVE
Comment: NORMAL
Diagnosis: NEGATIVE
Diagnosis: REACTIVE
Neisseria Gonorrhea: NEGATIVE
Trichomonas: POSITIVE — AB

## 2022-02-06 MED ORDER — METRONIDAZOLE 500 MG PO TABS: 500.0000 mg | ORAL_TABLET | Freq: Two times a day (BID) | ORAL | 0 refills | Status: DC

## 2022-02-06 NOTE — Addendum Note (Signed)
Addended by: Latrelle Dodrill on: 02/06/2022 03:08 PM   Modules accepted: Orders

## 2022-03-11 ENCOUNTER — Encounter: Payer: Self-pay | Admitting: Family Medicine

## 2022-03-11 ENCOUNTER — Ambulatory Visit (INDEPENDENT_AMBULATORY_CARE_PROVIDER_SITE_OTHER): Payer: 59 | Admitting: Family Medicine

## 2022-03-11 VITALS — BP 130/82 | HR 78 | Ht 61.0 in | Wt 156.6 lb

## 2022-03-11 DIAGNOSIS — Z3041 Encounter for surveillance of contraceptive pills: Secondary | ICD-10-CM

## 2022-03-11 DIAGNOSIS — Z720 Tobacco use: Secondary | ICD-10-CM | POA: Diagnosis not present

## 2022-03-11 DIAGNOSIS — Z23 Encounter for immunization: Secondary | ICD-10-CM

## 2022-03-11 NOTE — Progress Notes (Signed)
  Date of Visit: 03/11/2022   SUBJECTIVE:   HPI:  Bethany Phelps presents today for follow up of smoking cessation  Tobacco use - smoking 3-4 cigarettes per day, down from about 5 cigs per day when I first saw her. She never got the gum, is just working on quitting on her own. It's been hard to do.  Periods - has been on OCPs for 1 month. Last Thursday 7/13 had one day of heavy bleeding, bled for a few days and then it stopped on Saturday. Had cramping. Typically has painful periods. Is on the placebo pills this week.  OBJECTIVE:   BP 130/82   Pulse 78   Ht 5\' 1"  (1.549 m)   Wt 156 lb 9.6 oz (71 kg)   LMP 03/07/2022   SpO2 100%   BMI 29.59 kg/m  Gen: no acute distress, pleasant, cooperative HEENT: normocephalic, atraumatic  Heart: regular rate and rhythm, no murmur Lungs: clear to auscultation bilaterally normal work of breathing  Neuro: alert, speech normal  ASSESSMENT/PLAN:   Health maintenance:  -Tdap given today  Contraception management Suspect menstrual irregularity was related to periods adjusting to new OCP.  We will give this more time and monitor.  Follow-up in October, sooner if causing significant issues before then.  Tobacco abuse Congratulated patient on backing down some on cigarette smoking.  She will continue her efforts at home, and we will follow-up in October to see how she is doing.  Discussed option of nicotine replacement including gum.  FOLLOW UP: Follow up in Oct for above issues  Nov J. [place], MD [facility]

## 2022-03-11 NOTE — Assessment & Plan Note (Signed)
Suspect menstrual irregularity was related to periods adjusting to new OCP.  We will give this more time and monitor.  Follow-up in October, sooner if causing significant issues before then.

## 2022-03-11 NOTE — Patient Instructions (Signed)
It was great to see you again today!  Follow up in October for smoking. If periods aren't better by then we'll discuss at that time. Call sooner if any issues.  Be well, Dr. Pollie Meyer

## 2022-03-11 NOTE — Assessment & Plan Note (Signed)
Congratulated patient on backing down some on cigarette smoking.  She will continue her efforts at home, and we will follow-up in October to see how she is doing.  Discussed option of nicotine replacement including gum.
